# Patient Record
Sex: Male | Born: 1975 | Race: White | Hispanic: No | Marital: Married | Smoking: Never smoker
Health system: Southern US, Community
[De-identification: ages and names within clinical notes are randomized; demographics above are authoritative.]

## PROBLEM LIST (undated history)

## (undated) DIAGNOSIS — I422 Other hypertrophic cardiomyopathy: Secondary | ICD-10-CM

## (undated) DIAGNOSIS — I10 Essential (primary) hypertension: Secondary | ICD-10-CM

## (undated) DIAGNOSIS — G4733 Obstructive sleep apnea (adult) (pediatric): Secondary | ICD-10-CM

## (undated) DIAGNOSIS — R002 Palpitations: Secondary | ICD-10-CM

## (undated) DIAGNOSIS — J302 Other seasonal allergic rhinitis: Secondary | ICD-10-CM

## (undated) DIAGNOSIS — G4730 Sleep apnea, unspecified: Secondary | ICD-10-CM

## (undated) HISTORY — PX: OTHER SURGICAL HISTORY: SHX169

## (undated) HISTORY — DX: Other seasonal allergic rhinitis: J30.2

## (undated) HISTORY — PX: TONSILLECTOMY: SUR1361

## (undated) HISTORY — DX: Sleep apnea, unspecified: G47.30

## (undated) HISTORY — DX: Other hypertrophic cardiomyopathy: I42.2

## (undated) HISTORY — DX: Obstructive sleep apnea (adult) (pediatric): G47.33

## (undated) HISTORY — DX: Palpitations: R00.2

## (undated) HISTORY — DX: Essential (primary) hypertension: I10

---

## 2012-12-03 ENCOUNTER — Encounter
Admission: RE | Disposition: A | Discharge status: Home / Self Care | Payer: Self-pay | Source: Ambulatory Visit | Attending: Cardiovascular Disease

## 2012-12-03 ENCOUNTER — Ambulatory Visit: Payer: No Typology Code available for payment source | Admitting: Cardiovascular Disease

## 2012-12-03 ENCOUNTER — Ambulatory Visit
Admission: RE | Admit: 2012-12-03 | Discharge: 2012-12-03 | Disposition: A | Discharge status: Home / Self Care | Payer: No Typology Code available for payment source | Source: Ambulatory Visit | Attending: Cardiovascular Disease | Admitting: Cardiovascular Disease

## 2012-12-03 DIAGNOSIS — I2510 Atherosclerotic heart disease of native coronary artery without angina pectoris: Secondary | ICD-10-CM | POA: Insufficient documentation

## 2012-12-03 DIAGNOSIS — R079 Chest pain, unspecified: Secondary | ICD-10-CM | POA: Insufficient documentation

## 2012-12-03 LAB — CBC AND DIFFERENTIAL
Basophils Absolute Automated: 0.02 (ref 0.00–0.20)
Basophils Automated: 0 %
Eosinophils Absolute Automated: 0.16 (ref 0.00–0.70)
Eosinophils Automated: 3 %
Hematocrit: 42.2 % (ref 42.0–52.0)
Hgb: 13.6 g/dL (ref 13.0–17.0)
Immature Granulocytes Absolute: 0.01
Immature Granulocytes: 0 %
Lymphocytes Absolute Automated: 1.67 (ref 0.50–4.40)
Lymphocytes Automated: 29 %
MCH: 25.3 pg — ABNORMAL LOW (ref 28.0–32.0)
MCHC: 32.2 g/dL (ref 32.0–36.0)
MCV: 78.6 fL — ABNORMAL LOW (ref 80.0–100.0)
MPV: 12 fL (ref 9.4–12.3)
Monocytes Absolute Automated: 0.43 (ref 0.00–1.20)
Monocytes: 8 %
Neutrophils Absolute: 3.47 (ref 1.80–8.10)
Neutrophils: 60 %
Nucleated RBC: 0 (ref 0–1)
Platelets: 134 — ABNORMAL LOW (ref 140–400)
RBC: 5.37 (ref 4.70–6.00)
RDW: 13 % (ref 12–15)
WBC: 5.76 (ref 3.50–10.80)

## 2012-12-03 LAB — BASIC METABOLIC PANEL
BUN: 15 mg/dL (ref 9.0–21.0)
CO2: 27 (ref 22–29)
Calcium: 9.2 mg/dL (ref 8.5–10.5)
Chloride: 108 — ABNORMAL HIGH (ref 98–107)
Creatinine: 1 mg/dL (ref 0.7–1.3)
Glucose: 82 mg/dL (ref 70–100)
Potassium: 4.1 (ref 3.5–5.1)
Sodium: 142 (ref 136–145)

## 2012-12-03 LAB — GFR: EGFR: >60

## 2012-12-03 LAB — PT/INR
PT INR: 1.1 (ref 0.9–1.1)
PT: 14.4 (ref 12.6–15.0)

## 2012-12-03 SURGERY — LEFT HEART CATH POSS PCI
Anesthesia: Conscious Sedation | Laterality: Left

## 2012-12-03 MED ORDER — ATORVASTATIN CALCIUM 10 MG PO TABS
40.0000 mg | ORAL_TABLET | Freq: Every day | ORAL | Status: AC
Start: 2012-12-03 — End: ?

## 2012-12-03 MED ORDER — IODIXANOL 320 MG/ML IV SOLN
107.00 mL | Freq: Once | INTRAVENOUS | Status: AC | PRN
Start: 2012-12-03 — End: 2012-12-03
  Administered 2012-12-03: 107 mL via INTRA_ARTERIAL

## 2012-12-03 MED ORDER — VERAPAMIL HCL 2.5 MG/ML IV SOLN
INTRAVENOUS | Status: AC
Start: 2012-12-03 — End: 2012-12-03
  Administered 2012-12-03: 5 mg
  Filled 2012-12-03: qty 2

## 2012-12-03 MED ORDER — SODIUM CHLORIDE 0.9 % IV SOLN
INTRAVENOUS | Status: DC
Start: 2012-12-03 — End: 2012-12-03

## 2012-12-03 MED ORDER — MIDAZOLAM HCL 2 MG/2ML IJ SOLN
INTRAMUSCULAR | Status: AC
Start: 2012-12-03 — End: 2012-12-03
  Administered 2012-12-03: 2 mg via INTRAVENOUS
  Filled 2012-12-03: qty 2

## 2012-12-03 MED ORDER — SODIUM CHLORIDE 0.9 % IV SOLN
INTRAVENOUS | Status: AC
Start: 2012-12-03 — End: 2012-12-03

## 2012-12-03 MED ORDER — FENTANYL CITRATE 0.05 MG/ML IJ SOLN
INTRAMUSCULAR | Status: AC
Start: 2012-12-03 — End: 2012-12-03
  Administered 2012-12-03: 50 ug
  Filled 2012-12-03: qty 2

## 2012-12-03 MED ORDER — HEPARIN WASH BOWL 5 UNITS/ML SOLN (CATH LAB)
Status: AC
Start: 2012-12-03 — End: 2012-12-03
  Filled 2012-12-03: qty 2

## 2012-12-03 MED ORDER — LIDOCAINE HCL (PF) 1 % IJ SOLN
INTRAMUSCULAR | Status: AC
Start: 2012-12-03 — End: 2012-12-03
  Filled 2012-12-03: qty 30

## 2012-12-03 MED ORDER — HEPARIN SODIUM (PORCINE) 1000 UNIT/ML IJ SOLN
INTRAMUSCULAR | Status: AC
Start: 2012-12-03 — End: 2012-12-03
  Administered 2012-12-03: 3000 [IU] via INTRAVENOUS
  Filled 2012-12-03: qty 10

## 2012-12-03 MED ORDER — ATORVASTATIN CALCIUM 10 MG PO TABS
40.0000 mg | ORAL_TABLET | Freq: Every day | ORAL | Status: DC
Start: 2012-12-03 — End: 2012-12-03

## 2012-12-03 MED ORDER — NITROGLYCERIN IN D5W 200-5 MCG/ML-% IV SOLN
INTRAVENOUS | Status: AC
Start: 2012-12-03 — End: 2012-12-03
  Filled 2012-12-03: qty 5

## 2012-12-03 NOTE — Progress Notes (Signed)
Received patient at time from rn sagun

## 2012-12-03 NOTE — Plan of Care (Signed)
Vasc band removed per protocol. Ambulated pt in hallway. Pt voided in bathroom. Denies any CP, SOB or dizziness.

## 2012-12-03 NOTE — Discharge Instructions (Addendum)
[facility]  [address]  [place], [place]    Interventional Cardiovascular Admission and Recovery  Post Catheterization Discharge Instructions  Arm Access          Access Site: Radial  Artery    Activity:  1. Do not lift anything greater than five (5) pounds in weight and no strenuous activity for 48 hours.  2. No driving for 48 hours following your procedure.    Access Site Care:  1. You may shower 24 hours after your procedure.  Leave the bandage in place and just let the water passively flow over the site.  After 48 hours REMOVE the dressing before or during your shower.  Again, let the water passively flow over the site, wash gently with your hand, then pat the area dry.    2. Do not rub, pick or scratch the area.   3. Do not apply creams, powders, lotions, or ointments to the site.   4. Apply a regular sized Band-Aid to the puncture site and change it daily for five (5) days.  You may shower daily.  5. If the puncture site looks like it is becoming infected or not healing properly, (red, hot to touch, drainage, and/or fever over 101 degrees F), call the doctor who performed the procedure.    Normal Observation:  1. You may feel tenderness.      2. You may experience some mild bruising.      Call 911 if:  1. You are experiencing unrelieved chest pain.  2. You notice bleeding either through the dressing or underneath the skin.  If the blood is trapped under the skin, the area will hurt, become swollen and hard.  If either happens, lay down flat and hold pressure on the site.  This is an arterial bleed, and may become an emergency if unattended.    3. Your arm becomes cold, numb, painful, grayish in color, or change from usual color/sensation      Atorvastatin Calcium Oral tablet  What is this medicine?  ATORVASTATIN (a TORE Verdi sta tin) is known as a HMG-CoA reductase inhibitor or 'statin'. It lowers the level of cholesterol and triglycerides in the blood. This drug may also  reduce the risk of heart attack, stroke, or other health problems in patients with risk factors for heart disease. Diet and lifestyle changes are often used with this drug.  This medicine may be used for other purposes; ask your health care provider or pharmacist if you have questions.  What should I tell my health care provider before I take this medicine?  They need to know if you have any of these conditions:   frequently drink alcoholic beverages   history of stroke, TIA   kidney disease   liver disease   muscle aches or weakness   other medical condition   an unusual or allergic reaction to atorvastatin, other medicines, foods, dyes, or preservatives   pregnant or trying to get pregnant   breast-feeding  How should I use this medicine?  Take this medicine by mouth with a glass of water. Follow the directions on the prescription label. You can take this medicine with or without food. Take your doses at regular intervals. Do not take your medicine more often than directed.  Talk to your pediatrician regarding the use of this medicine in children. While this drug may be prescribed for children as young as 10 years old for selected conditions, precautions do apply.    Overdosage: If you think you have taken too much of this medicine contact a poison control center or emergency room at once.  NOTE: This medicine is only for you. Do not share this medicine with others.  What if I miss a dose?  If you miss a dose, take it as soon as you can. If it is almost time for your next dose, take only that dose. Do not take double or extra doses.  What may interact with this medicine?  Do not take this medicine with any of the following medications:   red yeast rice   telaprevir   telithromycin   voriconazole  This medicine may also interact with the following medications:   alcohol   antiviral medicines for HIV or AIDS   boceprevir   certain antibiotics like clarithromycin, erythromycin,  troleandomycin   certain medicines for cholesterol like fenofibrate or gemfibrozil   cimetidine   clarithromycin   colchicine   cyclosporine   digoxin   male hormones, like estrogens or progestins and birth control pills   grapefruit juice   medicines for fungal infections like fluconazole, itraconazole, ketoconazole   niacin   rifampin   spironolactone  This list may not describe all possible interactions. Give your health care provider a list of all the medicines, herbs, non-prescription drugs, or dietary supplements you use. Also tell them if you smoke, drink alcohol, or use illegal drugs. Some items may interact with your medicine.  What should I watch for while using this medicine?  Visit your doctor or health care professional for regular check-ups. You may need regular tests to make sure your liver is working properly.  Tell your doctor or health care professional right away if you get any unexplained muscle pain, tenderness, or weakness, especially if you also have a fever and tiredness. Your doctor or health care professional may tell you to stop taking this medicine if you develop muscle problems. If your muscle problems do not go away after stopping this medicine, contact your health care professional.  This drug is only part of a total heart-health program. Your doctor or a dietician can suggest a low-cholesterol and low-fat diet to help. Avoid alcohol and smoking, and keep a proper exercise schedule.  Do not use this drug if you are pregnant or breast-feeding. Serious side effects to an unborn child or to an infant are possible. Talk to your doctor or pharmacist for more information.  This medicine may affect blood sugar levels. If you have diabetes, check with your doctor or health care professional before you change your diet or the dose of your diabetic medicine.  If you are going to have surgery tell your health care professional that you are taking this drug.  What side effects may I  notice from receiving this medicine?  Side effects that you should report to your doctor or health care professional as soon as possible:   allergic reactions like skin rash, itching or hives, swelling of the face, lips, or tongue   dark urine   fever   joint pain   muscle cramps, pain   redness, blistering, peeling or loosening of the skin, including inside the mouth   trouble passing urine or change in the amount of urine   unusually weak or tired   yellowing of eyes or skin  Side effects that usually do not require medical attention (report to your doctor or health care professional if they continue or are bothersome):   constipation     heartburn   stomach gas, pain, upset  This list may not describe all possible side effects. Call your doctor for medical advice about side effects. You may report side effects to FDA at 1-800-FDA-1088.  Where should I keep my medicine?  Keep out of the reach of children.  Store at room temperature between 20 to 25 degrees C (68 to 77 degrees F). Throw away any unused medicine after the expiration date.  NOTE:This sheet is a summary. It may not cover all possible information. If you have questions about this medicine, talk to your doctor, pharmacist, or health care provider. Copyright 2014 Gold Standard

## 2012-12-03 NOTE — Progress Notes (Signed)
Received pt from Cath lab via bed. Placed on tele. VSS. Family called. Oriented pt to room. Call bell within reach. Vasc band in place to right radial artery. Aldrete score- 10.

## 2012-12-03 NOTE — Progress Notes (Signed)
Radial access site slightly swollen at the site. Manual pressure applied. Arm board applied. Monitor pt for 20 minutes.  Report given to [facility].     IV [place]'d Cannula intact. Tele [place]'d. [place] instruction given to pt and his family member. Prescription given to pt. Verbalized understanding. Bleeding precaution, safety precaution reviewed. Lawson pt if no bleeding or hematoma.

## 2012-12-03 NOTE — Plan of Care (Signed)
Pre- Cath Teaching and Learning Objectives   Learner: Lucas Sanchez,   Preference for learning: Verbal  Teaching Method: Verbal Instruction   Outcome of Learning: 5/5    Described/Demonstrated the following:     + Responsibilities of patient's care  + Cardiac Cath  + Purpose of procedure  + Need to be NPO pre-procedure  + Need for maintaining bedrest & straight leg post-procedure & sheath removal.  + Necessary fluid intake after procedure  + Symptoms of bleeding & states plan to notify nurse.

## 2012-12-03 NOTE — Progress Notes (Signed)
Radial site remains d/i with no noted hematoma/ [place] via w/c escorted per staff and family

## 2012-12-03 NOTE — H&P (Signed)
BRIEF H&P Includes ASA    Date Time: 12/03/2012 3:57 PM      PROCEDURALIST COMMENTS BELOW:         INDICATIONS:   Chest pain and abnormal ECG       REVIEW OF SYSTEMS:   YES  ( x )         ALLERGIES:     NO  ( x )   YES  (  )        PHYSICAL EXAM     AIRWAY CLASSIFICATION:    CLASS I   (  x)     CLASS II  (  )    CLASS III  (  )     CLASS IV  (  )    CARDIAC :   (x  )  RRR  (  )  IRREG  (  )  MURMUR      LUNGS:   (x  )  CLEAR  (  )  DIMINISHED    (  ) LEFT   (  )  RIGHT  (  )  ABSENT          (  ) LEFT   (  )  RIGHT  (  )  TUBES            (  ) LEFT   (  )  RIGHT          ABDOMEN:         NEURO:         OTHER:       LABS:     Lab Results   Component Value Date/Time    WBC 5.76 12/03/2012  2:15 PM    HCT 42.2 12/03/2012  2:15 PM    PLT 134* 12/03/2012  2:15 PM    INR 1.1 12/03/2012  2:15 PM    PT 14.4 12/03/2012  2:15 PM    BUN 15.0 12/03/2012  2:15 PM    GLU 82 12/03/2012  2:15 PM    K 4.1 12/03/2012  2:15 PM           ASA PHYSICAL STATUS   (x  )  ASA 1   HEALTHY PATIENT  (  )  ASA 2   MILD SYSTEMIC ILLNESS  (  )  ASA 3   SYSTEMIC DISEASE, NOT INCAPACITATING  (  )  ASA 4   SEVERE SYSTEMIC DISEASE, DISEASE IS CONSTANT THREAT TO                         LIFE  (  )  ASA 5   MORIBUND CONDITION, NOT EXPECTED TO LIVE >24 HOURS            IRRESPECTIVE OF PROCEDURE  (  )  E           EMERGENCY PROCEDURE       PLANNED SEDATION:   (  ) NO SEDATION  ( x ) MODERATE SEDATION  (  ) DEEP SEDATION WITH ANESTHESIA      CONCLUSION:   PATIENT HAS BEEN REASSESSED IMMEDIATELY PRIOR TO THE PROCEDURE   AND IS AN APPROPRIATE CANDIDATE FOR THE PLANNED SEDATION AND   PROCEDURE.  RISKS, BENEFITS AND ALTERNATIVES TO THE PLANNED   PROCEDURE AND SEDATION HAVE BEEN EXPLAINED TO THE PATIENT   OR GUARDIAN.    ( x )  YES  (  )  EMERGENCY CONSENT  Signed by Geanie Cooley.

## 2013-01-21 ENCOUNTER — Other Ambulatory Visit: Payer: Self-pay | Admitting: Cardiovascular Disease

## 2013-01-21 DIAGNOSIS — I422 Other hypertrophic cardiomyopathy: Secondary | ICD-10-CM

## 2013-01-21 DIAGNOSIS — R9431 Abnormal electrocardiogram [ECG] [EKG]: Secondary | ICD-10-CM

## 2013-01-25 ENCOUNTER — Ambulatory Visit: Payer: No Typology Code available for payment source | Attending: Cardiovascular Disease

## 2013-01-25 DIAGNOSIS — R079 Chest pain, unspecified: Secondary | ICD-10-CM | POA: Insufficient documentation

## 2013-01-25 DIAGNOSIS — I517 Cardiomegaly: Secondary | ICD-10-CM | POA: Insufficient documentation

## 2013-01-27 MED ORDER — GADOBUTROL 1 MMOL/ML IV SOLN
15.0000 mL | Freq: Once | INTRAVENOUS | Status: AC | PRN
Start: 2013-01-27 — End: 2013-01-25
  Administered 2013-01-25: 15 mmol via INTRAVENOUS
  Filled 2013-01-27: qty 15

## 2013-08-15 DIAGNOSIS — I422 Other hypertrophic cardiomyopathy: Secondary | ICD-10-CM | POA: Insufficient documentation

## 2015-03-06 ENCOUNTER — Ambulatory Visit (INDEPENDENT_AMBULATORY_CARE_PROVIDER_SITE_OTHER): Payer: Self-pay | Admitting: Cardiovascular Disease

## 2015-03-16 ENCOUNTER — Ambulatory Visit (INDEPENDENT_AMBULATORY_CARE_PROVIDER_SITE_OTHER): Payer: Self-pay

## 2016-04-21 ENCOUNTER — Ambulatory Visit (INDEPENDENT_AMBULATORY_CARE_PROVIDER_SITE_OTHER): Payer: Self-pay | Admitting: Cardiovascular Disease

## 2017-03-02 ENCOUNTER — Other Ambulatory Visit (INDEPENDENT_AMBULATORY_CARE_PROVIDER_SITE_OTHER): Payer: Self-pay | Admitting: Family Medicine

## 2017-05-07 ENCOUNTER — Ambulatory Visit (INDEPENDENT_AMBULATORY_CARE_PROVIDER_SITE_OTHER): Payer: Self-pay | Admitting: Cardiovascular Disease

## 2017-07-13 ENCOUNTER — Other Ambulatory Visit (INDEPENDENT_AMBULATORY_CARE_PROVIDER_SITE_OTHER): Payer: Self-pay | Admitting: Physician Assistant

## 2018-01-25 DIAGNOSIS — G4733 Obstructive sleep apnea (adult) (pediatric): Secondary | ICD-10-CM | POA: Insufficient documentation

## 2018-01-25 DIAGNOSIS — I10 Essential (primary) hypertension: Secondary | ICD-10-CM | POA: Insufficient documentation

## 2018-12-22 LAB — CMP12+2AC
ALT: 36 IU/L (ref 0–44)
AST (SGOT): 21 IU/L (ref 0–40)
Albumin/Globulin Ratio: 2.1 (ref 1.2–2.2)
Albumin: 4.5 g/dL (ref 3.5–5.5)
Alkaline Phosphatase: 43 IU/L (ref 39–117)
BUN / Creatinine Ratio: 19 (ref 9–20)
BUN: 20 mg/dL (ref 6–24)
Bilirubin, Total: 0.5 mg/dL (ref 0.0–1.2)
Calcium: 9.2 mg/dL (ref 8.7–10.2)
Chloride: 103 mmol/L (ref 96–106)
Creatinine: 1.07 mg/dL (ref 0.76–1.27)
EGFR: 85 mL/min/{1.73_m2} (ref >59)
EGFR: 98 mL/min/{1.73_m2} (ref >59)
Globulin, Total: 2.1 g/dL (ref 1.5–4.5)
Glucose: 84 mg/dL (ref 65–99)
Potassium: 4.5 mmol/L (ref 3.5–5.2)
Protein, Total: 6.6 g/dL (ref 6.0–8.5)
Sodium: 140 mmol/L (ref 134–144)
Uric acid: 6.3 mg/dL (ref 3.7–8.6)

## 2018-12-22 LAB — FFPC LIPID PANEL AND CHOL/HDL RATIO
Cholesterol / HDL Ratio: 7.4 ratio — ABNORMAL HIGH (ref 0.0–5.0)
Cholesterol: 185 mg/dL (ref 100–199)
HDL: 25 mg/dL — ABNORMAL LOW (ref >39)
Triglycerides: 451 mg/dL — ABNORMAL HIGH (ref 0–149)

## 2018-12-22 LAB — REFLEX - DIRECT LDL: LDL CHOLESTEROL DIRECT: 81 mg/dL (ref 0–99)

## 2018-12-22 LAB — UPPER RESPIRATORY CULTURE

## 2019-09-30 ENCOUNTER — Ambulatory Visit (INDEPENDENT_AMBULATORY_CARE_PROVIDER_SITE_OTHER): Payer: 59

## 2019-09-30 ENCOUNTER — Encounter: Payer: Self-pay | Admitting: Podiatry

## 2019-09-30 ENCOUNTER — Ambulatory Visit (INDEPENDENT_AMBULATORY_CARE_PROVIDER_SITE_OTHER): Payer: 59 | Admitting: Podiatry

## 2019-09-30 ENCOUNTER — Other Ambulatory Visit: Payer: Self-pay | Admitting: *Deleted

## 2019-09-30 ENCOUNTER — Encounter: Payer: Self-pay | Admitting: *Deleted

## 2019-09-30 ENCOUNTER — Other Ambulatory Visit: Payer: Self-pay

## 2019-09-30 DIAGNOSIS — M7751 Other enthesopathy of right foot: Secondary | ICD-10-CM

## 2019-09-30 DIAGNOSIS — M7670 Peroneal tendinitis, unspecified leg: Secondary | ICD-10-CM

## 2019-09-30 DIAGNOSIS — M779 Enthesopathy, unspecified: Secondary | ICD-10-CM | POA: Diagnosis not present

## 2019-09-30 DIAGNOSIS — M7672 Peroneal tendinitis, left leg: Secondary | ICD-10-CM | POA: Diagnosis not present

## 2019-09-30 MED ORDER — DICLOFENAC SODIUM 75 MG PO TBEC: 75.0000 mg | DELAYED_RELEASE_TABLET | Freq: Two times a day (BID) | ORAL | 2 refills | Status: DC

## 2019-09-30 NOTE — Progress Notes (Signed)
Subjective:   Patient ID: Paul English, male   DOB: 44 y.o.   MRN: [id number]   HPI Patient states he developed a lot of pain over the last few months in his right over left foot and does not remember specific injury.  States it is on the outside dorsum of the foot and that it is bothersome and worse after periods of sitting and when getting up in the morning.  Patient does not smoke likes to be active if possible   Review of Systems  All other systems reviewed and are negative.       Objective:  Physical Exam Vitals and nursing note reviewed.  Constitutional:      Appearance: He is well-developed.  Pulmonary:     Effort: Pulmonary effort is normal.  Musculoskeletal:        General: Normal range of motion.  Skin:    General: Skin is warm.  Neurological:     Mental Status: He is alert.     Neurovascular status intact muscle strength found to be adequate range of motion within normal limits.  Patient is noted to have exquisite discomfort dorsal lateral aspect foot region bilateral with the right foot being more distal than the left in the peroneal tertius and base of fourth fifth metatarsal complex along with the extensor tendon.  Patient is found to have good digital perfusion well oriented x3     Assessment:  Probability for inflammatory tendinitis bilateral which may have been injury or other pathology     Plan:  H&P conditions reviewed and I have recommended aggressive conservative treatment.  I did sterile prep of each foot I injected the tendon complex bilateral 3 mg Kenalog 5 mg Xylocaine I advised on ice therapy anti-inflammatories and placed on diclofenac.  Reappoint 3 weeks to recheck  X-rays indicate no signs of stress fracture or arthritic process associated with problem

## 2019-10-04 ENCOUNTER — Other Ambulatory Visit: Payer: Self-pay | Admitting: Podiatry

## 2019-10-04 DIAGNOSIS — M779 Enthesopathy, unspecified: Secondary | ICD-10-CM

## 2019-10-21 ENCOUNTER — Encounter: Payer: Self-pay | Admitting: Podiatry

## 2019-10-21 ENCOUNTER — Ambulatory Visit (INDEPENDENT_AMBULATORY_CARE_PROVIDER_SITE_OTHER): Payer: 59 | Admitting: Podiatry

## 2019-10-21 ENCOUNTER — Other Ambulatory Visit: Payer: Self-pay

## 2019-10-21 VITALS — Temp 97.7°F

## 2019-10-21 DIAGNOSIS — M779 Enthesopathy, unspecified: Secondary | ICD-10-CM | POA: Diagnosis not present

## 2019-10-21 DIAGNOSIS — M7670 Peroneal tendinitis, unspecified leg: Secondary | ICD-10-CM | POA: Diagnosis not present

## 2019-10-21 NOTE — Progress Notes (Signed)
Subjective:   Patient ID: Paul English, male   DOB: 44 y.o.   MRN: [id number]   HPI Patient presents stating that his feet feel quite a bit better and his goal now is to lose weight and be able to be more active   ROS      Objective:  Physical Exam  Neurovascular status intact with significant diminishment of discomfort in the sinus tarsi and peroneal tertius brevis group bilateral     Assessment:  Improvement of tendinitis bilateral capsulitis condition with mild discomfort still noted only upon deep palpation     Plan:  H&P reviewed condition at great length discussed gradual increase in activity continue oral anti-inflammatories topical medicines as needed shoe gear modifications and also reviewed with him the consideration for weight loss which I think at his stage would be important.  Patient wants to undergo this and at this point will start to increase activity and is encouraged to call with questions concerns

## 2020-02-01 ENCOUNTER — Other Ambulatory Visit: Payer: Self-pay | Admitting: Podiatry

## 2020-03-28 ENCOUNTER — Emergency Department (HOSPITAL_BASED_OUTPATIENT_CLINIC_OR_DEPARTMENT_OTHER): Payer: 59

## 2020-03-28 ENCOUNTER — Other Ambulatory Visit: Payer: Self-pay

## 2020-03-28 ENCOUNTER — Emergency Department (HOSPITAL_BASED_OUTPATIENT_CLINIC_OR_DEPARTMENT_OTHER)
Admission: EM | Admit: 2020-03-28 | Discharge: 2020-03-28 | Disposition: A | Discharge status: Home / Self Care | Payer: 59 | Attending: Emergency Medicine | Admitting: Emergency Medicine

## 2020-03-28 ENCOUNTER — Encounter (HOSPITAL_BASED_OUTPATIENT_CLINIC_OR_DEPARTMENT_OTHER): Payer: Self-pay

## 2020-03-28 DIAGNOSIS — Z79899 Other long term (current) drug therapy: Secondary | ICD-10-CM | POA: Insufficient documentation

## 2020-03-28 DIAGNOSIS — I10 Essential (primary) hypertension: Secondary | ICD-10-CM | POA: Insufficient documentation

## 2020-03-28 DIAGNOSIS — Z7982 Long term (current) use of aspirin: Secondary | ICD-10-CM | POA: Insufficient documentation

## 2020-03-28 DIAGNOSIS — R0781 Pleurodynia: Secondary | ICD-10-CM | POA: Diagnosis present

## 2020-03-28 LAB — CBC WITH DIFFERENTIAL/PLATELET
Abs Immature Granulocytes: 0 10*3/uL (ref 0.00–0.07)
Basophils Absolute: 0 10*3/uL (ref 0.0–0.1)
Basophils Relative: 1 %
Eosinophils Absolute: 0.1 10*3/uL (ref 0.0–0.5)
Eosinophils Relative: 2 %
HCT: 46.3 % (ref 39.0–52.0)
Hemoglobin: 14.9 g/dL (ref 13.0–17.0)
Immature Granulocytes: 0 %
Lymphocytes Relative: 31 %
Lymphs Abs: 1.9 10*3/uL (ref 0.7–4.0)
MCH: 26.3 pg (ref 26.0–34.0)
MCHC: 32.2 g/dL (ref 30.0–36.0)
MCV: 81.8 fL (ref 80.0–100.0)
Monocytes Absolute: 0.6 10*3/uL (ref 0.1–1.0)
Monocytes Relative: 9 %
Neutro Abs: 3.5 10*3/uL (ref 1.7–7.7)
Neutrophils Relative %: 57 %
Platelets: 162 10*3/uL (ref 150–400)
RBC: 5.66 MIL/uL (ref 4.22–5.81)
RDW: 13 % (ref 11.5–15.5)
WBC: 6 10*3/uL (ref 4.0–10.5)
nRBC: 0 % (ref 0.0–0.2)

## 2020-03-28 LAB — COMPREHENSIVE METABOLIC PANEL
ALT: 49 U/L — ABNORMAL HIGH (ref 0–44)
AST: 31 U/L (ref 15–41)
Albumin: 4.3 g/dL (ref 3.5–5.0)
Alkaline Phosphatase: 41 U/L (ref 38–126)
Anion gap: 8 (ref 5–15)
BUN: 20 mg/dL (ref 6–20)
CO2: 24 mmol/L (ref 22–32)
Calcium: 9 mg/dL (ref 8.9–10.3)
Chloride: 106 mmol/L (ref 98–111)
Creatinine, Ser: 1.08 mg/dL (ref 0.61–1.24)
GFR calc Af Amer: >60 mL/min (ref >60)
GFR calc non Af Amer: >60 mL/min (ref >60)
Glucose, Bld: 83 mg/dL (ref 70–99)
Potassium: 4.4 mmol/L (ref 3.5–5.1)
Sodium: 138 mmol/L (ref 135–145)
Total Bilirubin: 0.6 mg/dL (ref 0.3–1.2)
Total Protein: 7 g/dL (ref 6.5–8.1)

## 2020-03-28 LAB — URINALYSIS, ROUTINE W REFLEX MICROSCOPIC
Bilirubin Urine: NEGATIVE
Glucose, UA: NEGATIVE mg/dL
Hgb urine dipstick: NEGATIVE
Ketones, ur: NEGATIVE mg/dL
Leukocytes,Ua: NEGATIVE
Nitrite: NEGATIVE
Protein, ur: NEGATIVE mg/dL
Specific Gravity, Urine: >1.03 — ABNORMAL HIGH (ref 1.005–1.030)
pH: 6 (ref 5.0–8.0)

## 2020-03-28 LAB — D-DIMER, QUANTITATIVE: D-Dimer, Quant: 0.28 ug/mL-FEU (ref 0.00–0.50)

## 2020-03-28 LAB — LIPASE, BLOOD: Lipase: 28 U/L (ref 11–51)

## 2020-03-28 NOTE — ED Provider Notes (Signed)
[facility] EMERGENCY DEPARTMENT Provider Note   CSN: [id number] Arrival date & time: 03/28/20  1657     History Chief Complaint  Patient presents with  . Flank Pain    Paul English is a 44 y.o. male.  Patient is a 44 year old male with a history of hypertension, obesity and sleep apnea who presents with right side chest pain. He has some pain in his right lower ribs. It started yesterday. Is been fairly constant. Is been gradually getting worse. It hurts when he moves and hurts when he takes a deep breath. He was moving some furniture but denies any other recent injuries. No leg pain or swelling. No history of VTE. No recent surgeries or immobilization. He does not report any shortness of breath. No other chest pain. It radiates around the area but does not go down into his abdomen. No associated back pain. No urinary symptoms. No nausea or vomiting. No known fevers. No cough or cold symptoms. He has not taken anything for the pain.        History reviewed. No pertinent past medical history.  Patient Active Problem List   Diagnosis Date Noted  . Severe obesity (BMI 35.0-39.9) with comorbidity (HCC) 09/08/2018  . Hypertension, essential 01/25/2018  . OSA (obstructive sleep apnea) 01/25/2018  . 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 No pertinent surgical history.     No family history on file.  Social History   Tobacco Use  . Smoking status: Never Smoker  . Smokeless tobacco: Never Used  Vaping Use  . Vaping Use: Never used  Substance Use Topics  . Alcohol use: Not on file  . Drug use: Never    Home Medications Prior to Admission medications   Medication Sig Start Date End Date Taking? Authorizing Provider  aspirin EC 81 MG tablet Take 81 mg by mouth daily. Swallow whole.   Yes [provider]  candesartan (ATACAND) 4 MG tablet Take 4 mg by mouth daily. 10/15/19  Yes [provider]  cyclobenzaprine  (FLEXERIL) 5 MG tablet Take 5 mg by mouth at bedtime.   Yes [provider]  fexofenadine (ALLEGRA) 60 MG tablet Take 60 mg by mouth at bedtime.   Yes [provider]  Multiple Vitamin (MULTIVITAMIN) tablet Take 1 tablet by mouth daily.   Yes [provider]  verapamil (CALAN-SR) 180 MG CR tablet Take 180 mg by mouth daily. 10/15/19  Yes [provider]  diclofenac (VOLTAREN) 75 MG EC tablet TAKE 1 TABLET BY MOUTH TWICE DAILY 02/02/20   Lenn Sink, DPM    Allergies    Patient has no known allergies.  Review of Systems   Review of Systems  Constitutional: Negative for chills, diaphoresis, fatigue and fever.  HENT: Negative for congestion, rhinorrhea and sneezing.   Eyes: Negative.   Respiratory: Negative for cough, chest tightness and shortness of breath.   Cardiovascular: Positive for chest pain (Right rib pain). Negative for leg swelling.  Gastrointestinal: Negative for abdominal pain, blood in stool, diarrhea, nausea and vomiting.  Genitourinary: Positive for flank pain. Negative for difficulty urinating, frequency and hematuria.  Musculoskeletal: Negative for arthralgias and back pain.  Skin: Negative for rash.  Neurological: Negative for dizziness, speech difficulty, weakness, numbness and headaches.    Physical Exam Updated Vital Signs BP 126/84 (BP Location: Right Arm)   Pulse 65   Temp 98.2 F (36.8 C) (Oral)   Resp 19   Ht 6\' 1"  (1.854 m)  Wt 129.3 kg   SpO2 100%   BMI 37.60 kg/m   Physical Exam Constitutional:      Appearance: He is well-developed.  HENT:     Head: Normocephalic and atraumatic.  Eyes:     Pupils: Pupils are equal, round, and reactive to light.  Cardiovascular:     Rate and Rhythm: Normal rate and regular rhythm.     Heart sounds: Normal heart sounds.     Comments: Positive point tenderness to the right lower ribs. No external lesions or bruising. No crepitus or deformity Pulmonary:     Effort: Pulmonary  effort is normal. No respiratory distress.     Breath sounds: Normal breath sounds. No wheezing or rales.  Chest:     Chest wall: No tenderness.  Abdominal:     General: Bowel sounds are normal.     Palpations: Abdomen is soft.     Tenderness: There is no abdominal tenderness. There is no guarding or rebound.  Musculoskeletal:        General: Normal range of motion.     Cervical back: Normal range of motion and neck supple.  Lymphadenopathy:     Cervical: No cervical adenopathy.  Skin:    General: Skin is warm and dry.     Findings: No rash.  Neurological:     Mental Status: He is alert and oriented to person, place, and time.     ED Results / Procedures / Treatments   Labs (all labs ordered are listed, but only abnormal results are displayed) Labs Reviewed  URINALYSIS, ROUTINE W REFLEX MICROSCOPIC - Abnormal; Notable for the following components:      Result Value   Specific Gravity, Urine >1.030 (*)    All other components within normal limits  COMPREHENSIVE METABOLIC PANEL - Abnormal; Notable for the following components:   ALT 49 (*)    All other components within normal limits  LIPASE, BLOOD  CBC WITH DIFFERENTIAL/PLATELET  D-DIMER, QUANTITATIVE (NOT AT [facility])    EKG None  Radiology DG Ribs Unilateral W/Chest Right  Result Date: 03/28/2020 CLINICAL DATA:  Right-sided rib pain with movement, no known injury, initial encounter EXAM: RIGHT RIBS AND CHEST - 3+ VIEW COMPARISON:  None. FINDINGS: Cardiac shadow is within normal limits. The lungs are well aerated bilaterally. Calcified granulomas and calcified mediastinal nodes are seen. No infiltrate or effusion is noted. No rib fracture is seen. IMPRESSION: Changes of prior granulomatous disease. No acute rib abnormality is noted. Electronically Signed   By: Alcide Clever M.D.   On: 03/28/2020 19:05    Procedures Procedures (including critical care time)  Medications Ordered in ED Medications - No data to display  ED  Course  I have reviewed the triage vital signs and the nursing notes.  Pertinent labs & imaging results that were available during my care of the patient were reviewed by me and considered in my medical decision making (see chart for details).    MDM Rules/Calculators/A&P                          Patient is a 44 year old male who presents with pain in his right lower ribs.  He is point tender on palpation of those ribs.  It started yesterday.  He does not report any known injury although he was moving some furniture recently.  He did have some pleuritic component but no shortness of breath.  He has a normal D-dimer and no other suggestions  of PE.  Chest x-ray shows no evidence of pneumonia or pneumothorax.  He does not have any associate abdominal pain.  There is no pain over the gallbladder on palpation.  There is no other pain that sounds more suggestive of a kidney stone.  There is also no associated hematuria.  No evidence of urinary tract infection.  I discussed these findings with the patient.  He declines need for any pain medication.  I suspect that this is musculoskeletal in nature.  He was discharged home in good condition.  He was advised use ibuprofen and Voltaren gel for symptomatic relief.  He was advised to follow-up with his PCP if his symptoms are not improving in the next few days.  Return precautions were given. Final Clinical Impression(s) / ED Diagnoses Final diagnoses:  Rib pain on right side    Rx / DC Orders ED Discharge Orders    None       Rolan Bucco, MD 03/28/20 2001

## 2020-03-28 NOTE — ED Triage Notes (Signed)
Pt c/o right flank pain started yesterday-denies other sx-NAD-steady gait

## 2020-03-28 NOTE — Discharge Instructions (Signed)
Use ibuprofen and Voltaren gel for symptomatic relief.  Follow-up with your primary care doctor for recheck if it is not getting better in the next few days.  Return here as needed if you have any worsening symptoms.

## 2020-04-29 ENCOUNTER — Other Ambulatory Visit: Payer: Self-pay | Admitting: Podiatry

## 2020-07-20 ENCOUNTER — Other Ambulatory Visit: Payer: 59

## 2020-07-20 DIAGNOSIS — Z20822 Contact with and (suspected) exposure to covid-19: Secondary | ICD-10-CM

## 2020-07-22 LAB — NOVEL CORONAVIRUS, NAA: SARS-CoV-2, NAA: DETECTED — AB

## 2020-07-22 LAB — SARS-COV-2, NAA 2 DAY TAT

## 2020-07-28 ENCOUNTER — Other Ambulatory Visit: Payer: Self-pay | Admitting: Podiatry

## 2020-07-30 NOTE — Telephone Encounter (Signed)
Please advise 

## 2020-07-31 NOTE — Telephone Encounter (Signed)
I will approve but he should be seen if still having pain

## 2020-10-09 NOTE — Progress Notes (Signed)
[facility] OFFICE  [address] [place], [place] [address]     Lucas Sanchez    Date of Visit:  05/07/2017  Date of Birth: 1975-08-18  Age: 45 yrs.   Medical Record Number: [id number]  __  CURRENT DIAGNOSES     1. 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 Palpitations, R00.2  3. Abnormal electrocardiogram [ECG] [EKG], R94.31  4. Hyperlipidemia, mixed, E78.2  __  ALLERGIES     Carvedilol, Fatigue  __  MEDICATIONS     1. aspirin 81 mg tablet,delayed release (DR/EC), 1  po qd  2. Flonase 50 mcg/actuation spray,suspension, PRN  3. multivitamin tablet, 1 po qd  4. fexofenadine 180 mg tablet, prn  5. carvedilol 6.25 mg tablet, 1 po bid  6. Aleve 220 mg capsule, PRN  7. candesartan 4 mg tablet, 1  po qd  8. Ranexa 500 mg tablet,extended release, 1 po bid  __  CHIEF COMPLAINT/REASON FOR VISIT  Followup of Abnormal electrocardiogram [ECG]  [EKG], Followup of Cardiomyopathy nonobstructive hypertrophic, Followup of Hyperlipidemia, mixed, Followup of Other cardiomyopathies and Followup of Palpitations  __  HISTORY OF PRESENT ILLNESS   [name] is a 45 year old man who returns to the office for follow-up. He has been feeling well from a cardiac standpoint but notes that he has been aware of episodes of atrial flutters that last for a few minutes. He has been taking all his medications  consistently but notes recent side effects of erectile dysfunction. [name] states that he previously took a beta blocker that decreased his energy levels and him feel like a "zombie". However, he cannot recollect the name of the medication. He exercises  occasionally and denies decrease in exertional tolerance, shortness of breath or anginal symptoms. His blood pressure in the office today is 134/84. However, this is higher than what he records at home. He reports that he has missed two doses over the  past two nights.   __  PAST HISTORY     Past Medical Illnesses : sleep apnea;  Past Cardiac Illnesses:  Palpitations, abnormal EKG, Hypertrophic cardiomyopathy, apical  variant, non-obstructive, Coronary Artery Disease; Infectious Diseases: No previous history of significant infectious diseases.;  Surgical Procedures: Tonsillectomy; Trauma History: No previous history of significant trauma.;  Cardiology Procedures-Invasive: Cardiac Cath (left) May 2014; Cardiology Procedures-Noninvasive: Echocardiogram  July 2014, cardiac MRI, July 2014, Holter Monitor September 2016; Cardiac Cath Results: 80 % stenosis PLR, small vessel, EF50%;  Left Ventricular Ejection Fraction: LVEF of 70% documented via echocardiogram on 01/11/2013  ___   FAMILY HISTORY  Father -- Myocardial infarction, Coronary Artery Disease  Mother --  Hypertension    __  CARDIAC RISK FACTORS     Tobacco Abuse: has never used tobacco;  Family History of Heart Disease: positive; Hyperlipidemia: negative;  Hypertension: negative;  Diabetes Mellitus: negative;  Prior History of Heart Disease: negative; Obesity: positive;  Sedentary Life Style:negative; JWJ:XBJYNWGN; Menopausal :not applicable  __  SOCIAL HISTORY     Alcohol Use: Does not use alcohol; Smoking: Does not smoke; Never smoker ([id number]);  Diet: Regular diet and 2 cups decaff coffee daily; Exercise: Exercises daily;   __   REVIEW OF SYSTEMS    General: Positive for feels well, no change in exercise tolerance.;  Integumentary: Denies any change in hair or nails, rashes, or skin lesions.; Eyes: Positive for wears  eye glasses/contact lenses; Ears, Nose, Throat, Mouth: Denies any hearing loss, epistaxis, hoarseness or difficulty speaking.; Respiratory: Positive for dyspnea with exertion,  Positive for sleep apnea; Abdominal : Denies ulcer  disease, hematochezia or melena.;Musculoskeletal:Denies any history of venous insufficiency, arthritic symptoms or back problems.;  Neurological : Denies any history of recurrent strokes, TIA, or seizure disorder.; Psychiatric: Positive  for anxiety; Endocrine: Denies  any history of weight change, heat/cold intolerance, polydipsia, or polyuria;  Hematologic/Immunologic: Positive for seasonal allergies  __  PHYSICAL EXAMINATION    Vital  Signs:  Blood Pressure:  134/84 Sitting, Left arm, large cuff  130/84 Sitting, Right arm, large cuff     Weight: 272.00 lbs.  Height: 73.00"  BMI:  36   Pulse: 61/min. Apical       Constitutional:  Cooperative, alert and oriented,well developed, well nourished, in no acute distress. Skin: Warm and dry to touch, no apparent skin lesions, or masses  noted. Head: Normocephalic, normal hair pattern, no masses or tenderness Eyes : conjunctivae and lids unremarkable, Funduscopic exam and visual fields not performed, glasses ENT: Ears, Nose and throat reveal no gross abnormalities  Neck: no JVD, no bruits, no masses, non-tender, carotid pulse(s) 3+, brisk Chest: Normal symmetry,  no tenderness to palpation, normal respiratory excursion, no intercostal retraction, no use of accessory muscles, normal diaphragmatic excursion, clear to auscultation and percussion. Cardiac : Regular rhythm, S1 normal, S2 normal, No S3 or S4, Apical impulse not displaced, no murmurs, gallops or rubs detected., no changes with Valsalva maneuver Abdomen : Abdomen soft, bowel sounds normoactive, no masses, no hepatosplenomegaly, non-tender, no bruits, midly obese Peripheral Pulses: bilateral radial pulse(s)  2+, bilateral dorsalis pedis pulse(s) 2+, bilateral posterior tibial pulse(s) 2+ Extremities/Back: No deformities, clubbing, cyanosis, erythema or edema  observed. There are no spinal abnormalities noted. Normal muscle strength and tone. Neurological: No gross motor or sensory deficits noted, affect appropriate,  oriented to time, person and place.   __    Medications added today by the physician:  candesartan 4 mg tablet, 1 po qd, 90  Ranexa  500 mg tablet,extended release, 1 po bid, 90    ECG: sinus rhythm with left ventricular hypertrophy and repolarization abnormality.      IMPRESSIONS:  1. Apical-variant hypertrophic cardiomyopathy, confirmed by evaluation at [facility]. He continues to do well and notes only very transient palpitations. His   exercise capacity appears good. His cardiac exam and ECG are unchanged in the office   today.   2. Obesity with sleep apnea.  3. Erectile dysfunction, probably  related to carvedilol. He was instructed to reduce to one-half   twice daily and discontinue all together if no significant improvement on lower dose.     RECOMMENDATIONS:  1. Reduce carvedilol to one-half tablet twice daily as noted above  and discontinue all together   if erectile dysfunction persists on lower dose.   2. Continued regular exercise was encouraged  3. Return visit in 6 months or sooner if new problems arise.     Documented by Armando Gang, acting as  a scribe for Dr. Kinnie Scales, MD, [facility].    I, Dr. Kinnie Scales, personally performed the services described in the documentation, as scribed by Malachi Bonds Adubofour in my presence, and it is both accurate and correct.    Kinnie Scales,  MD, [facility]     cc: Laurene Footman MD  ____________________________  Christianne Dolin  Diet_mgmt_edu,_guidance_and_counseling TODAY  12 Lead ECG Today  Return Visit 15 MIN 6 months

## 2020-10-09 NOTE — Progress Notes (Signed)
[facility] OFFICE  [address] [place], [place] [address]     Lucas Sanchez    Date of Visit:  03/06/2015  Date of Birth: [birth year]  Age: 45 yrs.   Medical Record Number: [id number]  __  CURRENT DIAGNOSES     1. Other cardiomyopathies, I42.8   2. Palpitations, R00.2  3. Abnormal electrocardiogram [ECG] [EKG], R94.31  4. 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 aspirin 81 mg tablet,delayed release (DR/EC),  1 po qd  2. Flonase 50 mcg/actuation spray,suspension, PRN  3. Ranexa 500 mg tablet,extended release, 1 po bid  4. candesartan 4 mg tablet, 1 po qd  5. multivitamin tablet, 1 po qd  6. fexofenadine 180 mg tablet, prn  __   CHIEF COMPLAINT/REASON FOR VISIT  Followup of Abnormal electrocardiogram [ECG] [EKG], Followup of Other cardiomyopathies and Followup of Palpitations  __   HISTORY OF PRESENT ILLNESS  Mr. Sowash returns to the office today for annual follow-up. He has noticed that he is more fatigue than before. He reports a 24-hour Holter monitor has been recommended for  his palpitations by the Cardiomyopathy Clinic at [facility]. He describes his palpitations as irregular heartbeat or fluttering that can sometimes wake him up at night and last for a few seconds before subsiding at a time. He also has been  experiencing consistent dizziness after he stands up from sitting that usually lasts for a few seconds. He had discontinued Ranexa at one point, but as he noticed that his palpitations and dizziness were worse off Ranexa, he started back on Ranexa and  reports that his symptoms have somewhat improved on Ranexa. He denies experiencing any chest pain, shortness of breath, syncopal episodes, or symptoms of congestive heart failure. His blood pressure in the office today is 138/70 mmHg. He has gained 11  pounds of weight since his previous office visit.    His lipid panel drawn on 12/16/2013 showed a total  cholesterol level of 162, LDL of 114, HDL of 28, and triglycerides of 100 mg/dL.  __   PAST HISTORY     Past Medical Illnesses: sleep apnea;   Past Cardiac Illnesses: Palpitations, abnormal EKG, Hypertrophic cardiomyopathy, apical variant, non-obstructive, Coronary Artery Disease;  Infectious Diseases: No previous history of significant infectious diseases.; Surgical Procedures: Tonsillectomy;  Trauma History: No previous history of significant trauma.; Cardiology Procedures-Invasive: Cardiac Cath  (left) May 2014; Cardiology Procedures-Noninvasive: Echocardiogram July 2014, cardiac MRI, July 2014;  Cardiac Cath Results: 80 % stenosis PLR, small vessel, EF50%; Left Ventricular Ejection Fraction: LVEF  of 70% documented via echocardiogram on 01/11/2013  ___  FAMILY HISTORY  Father --  Myocardial infarction, Coronary Artery Disease  Mother -- Hypertension    __  CARDIAC RISK FACTORS      Tobacco Abuse: has never used tobacco; Family History of Heart Disease: positive;  Hyperlipidemia: negative; Hypertension: negative;   Diabetes Mellitus: negative; Prior History of Heart Disease: negative;  Obesity: positive; Sedentary Life Style:negative;  JWJ:XBJYNWGN; Menopausal:not applicable  __   SOCIAL HISTORY    Alcohol Use: Does not use alcohol;  Smoking: Does not smoke; Never smoker ([id number]); Diet: Regular diet and 2 cups decaff coffee daily;  Exercise: Exercises daily;   __  PHYSICAL EXAMINATION    Vital Signs:   Blood Pressure:  138/70 Sitting, Left arm, large cuff  132/70 Sitting, Right arm, large cuff  Weight:  260.00 lbs.  Height: 73"  BMI: 34    Pulse: 57/min.       Constitutional: Cooperative, alert and oriented,well developed, well  nourished, in no acute distress. Skin: Warm and dry to touch, no apparent skin lesions, or masses noted.  Head: Normocephalic, normal hair pattern, no masses or tenderness Eyes: conjunctivae and lids unremarkable,  Funduscopic exam and visual fields not performed, glasses ENT: Ears,  Nose and throat reveal no gross abnormalities  Neck: no JVD, no bruits, no masses, non-tender, carotid pulse(s) 3+, brisk Chest: Normal symmetry,  no tenderness to palpation, normal respiratory excursion, no intercostal retraction, no use of accessory muscles, normal diaphragmatic excursion, clear to auscultation and percussion. Cardiac : Regular rhythm, S1 normal, S2 normal, No S3 or S4, Apical impulse not displaced, no murmurs, gallops or rubs detected., no changes with Valsalva maneuver Abdomen : Abdomen soft, bowel sounds normoactive, no masses, no hepatosplenomegaly, non-tender, no bruits Peripheral Pulses: bilateral radial pulse(s) 2+, bilateral  dorsalis pedis pulse(s) 2+, bilateral posterior tibial pulse(s) 2+ Extremities/Back: No deformities, clubbing, cyanosis, erythema or edema observed.  There are no spinal abnormalities noted. Normal muscle strength and tone. Neurological: No gross motor or sensory deficits noted, affect appropriate,  oriented to time, person and place.   __    ECG: Sinus rhythm with marked ST and T wave abnormality. No significant change compared to prior ECG.    IMPRESSIONS:  1. Apical-variant hypertrophic cardiomyopathy, confirmed by evaluation  at [facility]. He appears to be stable with occasional brief palpitations and transient  orthostatic dizziness. He noted increased palpitations when off Ranexa and has  since resumed it.   2. Obesity with sleep apnea.    RECOMMENDATIONS:   1. Holter monitoring (48 hours) to evaluate palpitations and be sure he is not having  evidence of significant ventricular tachycardia.  2. Follow-up with the Cardiomyopathy Clinic at [facility] as scheduled.   3. Return visit in one year  or sooner if new problems arise.  4. The importance of regular mild-to-moderate exercise was reviewed.    Documented by Caralyn Guile, acting as a scribe for Dr. Kinnie Scales, MD, [facility].    I, Dr. Kinnie Scales, personally performed the  services described in this  documentation, as scribed by Caralyn Guile, in my presence, and it is both accurate and complete.    Kinnie Scales, MD, [facility]    cc: Laurene Footman MD  ____________________________   TODAYS ORDERS  12 Lead ECG Today  Diet mgmt edu, guidance and counseling TODAY  Return Visit 30 MIN 1 year  Holter Monitor 48 hour 2 weeks

## 2020-10-09 NOTE — Progress Notes (Signed)
[facility] OFFICE  [address] [place], [place] [address]     Lucas Sanchez    Date of Visit:  04/21/2016  Date of Birth: 19-Jun-1976  Age: 45 yrs.   Medical Record Number: [id number]  __  CURRENT DIAGNOSES     1. 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 Palpitations, R00.2  3. Abnormal electrocardiogram [ECG] [EKG], R94.31  4. Other cardiomyopathies, I42.8  __  ALLERGIES     No Known Drug Allergies  __  MEDICATIONS     1. aspirin 81 mg tablet,delayed release (DR/EC),  1 po qd  2. Flonase 50 mcg/actuation spray,suspension, PRN  3. Ranexa 500 mg tablet,extended release, 1 po bid  4. candesartan 4 mg tablet, 1 po qd  5. multivitamin tablet, 1 po qd  6. fexofenadine 180 mg tablet, prn  7. carvedilol  6.25 mg tablet, 1 po bid  8. Aleve 220 mg capsule, PRN  __  CHIEF COMPLAINT/REASON FOR VISIT  Followup of Cardiomyopathy nonobstructive hypertrophic,  Followup of Other cardiomyopathies and Followup of Palpitations  __  HISTORY OF PRESENT ILLNESS  Lucas Sanchez is a 45 year old male here  for a follow-up visit. He complains of some chest discomfort and some shortness of breath. He noticed more palpitations after going off of renexa, but once he started again his palpitations became far less frequent with palpitation episodes only lasting  2-3 seconds. He also complains of fatigue which he notices may be caused by his sleep apnea. He does not exercise regularly which he states may have caused weight gain and fatigue. His blood pressure today is 118/64.   A lipid panel drawn on September 03, 2015 showed triglycerides is 134, cholesterol is 192, HDL is 32, and LDL is 133 mg/dL.   A Holter report performed on March 16, 2015 shows the underlying rhythm was sinus, no significant supraventricular or ventricular ectopy, and the overall  heart rate ranging from 43 to 111 beats per minute, averaging 70 beats per minute.   __  PAST HISTORY      Past Medical Illnesses: sleep apnea;  Past Cardiac  Illnesses: Palpitations, abnormal EKG, Hypertrophic  cardiomyopathy, apical variant, non-obstructive, Coronary Artery Disease; Infectious Diseases: No previous history of significant infectious diseases.;  Surgical Procedures: Tonsillectomy; Trauma History: No previous history of significant trauma.;  Cardiology Procedures-Invasive: Cardiac Cath (left) May 2014; Cardiology Procedures-Noninvasive: Echocardiogram  July 2014, cardiac MRI, July 2014, Holter Monitor September 2016; Cardiac Cath Results: 80 % stenosis PLR, small vessel, EF50%;  Left Ventricular Ejection Fraction: LVEF of 70% documented via echocardiogram on 01/11/2013  ___   FAMILY HISTORY  Father -- Myocardial infarction, Coronary Artery Disease  Mother --  Hypertension    __  CARDIAC RISK FACTORS     Tobacco Abuse: has never used tobacco;  Family History of Heart Disease: positive; Hyperlipidemia: negative;  Hypertension: negative;  Diabetes Mellitus: negative;  Prior History of Heart Disease: negative; Obesity: positive;  Sedentary Life Style:negative; JWJ:XBJYNWGN; Menopausal :not applicable  __  SOCIAL HISTORY     Alcohol Use: Does not use alcohol; Smoking: Does not smoke; Never smoker ([id number]);  Diet: Regular diet and 2 cups decaff coffee daily; Exercise: Exercises daily;   __   REVIEW OF SYSTEMS    General: Positive for fatigue, Positive for weight gain;  Integumentary: Denies any change in hair or nails, rashes, or skin lesions.; Eyes: Positive for wears  eye glasses/contact lenses; Ears, Nose, Throat, Mouth: Denies any hearing loss, epistaxis, hoarseness or  difficulty speaking.; Respiratory: Positive for dyspnea with exertion, Positive for sleep apnea; Abdominal : Denies ulcer  disease, hematochezia or melena.;Musculoskeletal:Denies any history of venous insufficiency, arthritic symptoms or back problems.;  Neurological : Denies any history of recurrent strokes, TIA, or seizure disorder.; Psychiatric: Positive  for anxiety; Endocrine: Denies  any history of weight change, heat/cold intolerance, polydipsia, or polyuria;  Hematologic/Immunologic: Positive for seasonal allergies  __  PHYSICAL EXAMINATION    Vital  Signs:  Blood Pressure:  118/66 Sitting, Left arm, large cuff  118/64 Sitting, Left arm, large cuff     Weight: 270.00 lbs.  Height: 73"  BMI:  35   Pulse: 61/min. Apical Regular       Constitutional:  Cooperative, alert and oriented,well developed, well nourished, in no acute distress. Skin: Warm and dry to touch, no apparent skin lesions, or masses  noted. Head: Normocephalic, normal hair pattern, no masses or tenderness Eyes : conjunctivae and lids unremarkable, Funduscopic exam and visual fields not performed, glasses ENT: Ears, Nose and throat reveal no gross abnormalities  Neck: no JVD, no bruits, no masses, non-tender, carotid pulse(s) 3+, brisk Chest: Normal symmetry,  no tenderness to palpation, normal respiratory excursion, no intercostal retraction, no use of accessory muscles, normal diaphragmatic excursion, clear to auscultation and percussion. Cardiac : Regular rhythm, S1 normal, S2 normal, No S3 or S4, Apical impulse not displaced, no murmurs, gallops or rubs detected., no changes with Valsalva maneuver Abdomen : Abdomen soft, bowel sounds normoactive, no masses, no hepatosplenomegaly, non-tender, no bruits, moderately obese Peripheral Pulses: bilateral radial  pulse(s) 2+, bilateral dorsalis pedis pulse(s) 2+, bilateral posterior tibial pulse(s) 2+ Extremities/Back: No deformities, clubbing, cyanosis, erythema  or edema observed. There are no spinal abnormalities noted. Normal muscle strength and tone. Neurological: No gross motor or sensory deficits noted,  affect appropriate, oriented to time, person and place.   __  ECG: Sinus rhythm with marked ST and T wave abnormality suggestive of antero-lateral ischemia. No significant change to prior tracing.       IMPRESSIONS:  1. Apical-variant  hypertrophic cardiomyopathy, confirmed by  evaluation at [facility]. He continues to feel relatively well but has noted increased fatigue shortly   after starting carvedilol. His awareness of palpitations remain infrequent as he remains on  Renexa.   2. Obesity with sleep apnea.    RECOMMENDATIONS:    1. Continue medical regimen without change  2 Consider reducing morning dose of carvedilol or switching to long-acting preparation if his significant fatigue persists    3. Follow with his sleep specialist to be sure that his sleep settings are optimized   4. Return visit in one year, or sooner if new problems arise.  5. He will check with Doctor Darin Engels (who recently moved her clinic to [facility])   as  to whether they recommend additional surveillance testing.   6. The importance of regular mild-to-moderate exercise was reviewed.       cc: Laurene Footman MD  Elyse Hsu MD   ____________________________  Christianne Dolin  Return Visit 30 MIN 1 year  12 Lead ECG Today  Diet mgmt edu, guidance and counseling TODAY         Kinnie Scales III MD, [facility]

## 2022-05-06 ENCOUNTER — Other Ambulatory Visit: Payer: Self-pay

## 2022-05-06 ENCOUNTER — Emergency Department (HOSPITAL_BASED_OUTPATIENT_CLINIC_OR_DEPARTMENT_OTHER)
Admission: EM | Admit: 2022-05-06 | Discharge: 2022-05-06 | Disposition: A | Discharge status: Home / Self Care | Payer: 59 | Attending: Emergency Medicine | Admitting: Emergency Medicine

## 2022-05-06 ENCOUNTER — Encounter (HOSPITAL_BASED_OUTPATIENT_CLINIC_OR_DEPARTMENT_OTHER): Payer: Self-pay

## 2022-05-06 ENCOUNTER — Emergency Department (HOSPITAL_BASED_OUTPATIENT_CLINIC_OR_DEPARTMENT_OTHER): Payer: 59

## 2022-05-06 DIAGNOSIS — Z7982 Long term (current) use of aspirin: Secondary | ICD-10-CM | POA: Diagnosis not present

## 2022-05-06 DIAGNOSIS — N201 Calculus of ureter: Secondary | ICD-10-CM | POA: Insufficient documentation

## 2022-05-06 DIAGNOSIS — R109 Unspecified abdominal pain: Secondary | ICD-10-CM | POA: Diagnosis present

## 2022-05-06 LAB — COMPREHENSIVE METABOLIC PANEL
ALT: 34 U/L (ref 0–44)
AST: 20 U/L (ref 15–41)
Albumin: 4.6 g/dL (ref 3.5–5.0)
Alkaline Phosphatase: 38 U/L (ref 38–126)
Anion gap: 9 (ref 5–15)
BUN: 27 mg/dL — ABNORMAL HIGH (ref 6–20)
CO2: 26 mmol/L (ref 22–32)
Calcium: 9.2 mg/dL (ref 8.9–10.3)
Chloride: 106 mmol/L (ref 98–111)
Creatinine, Ser: 1.29 mg/dL — ABNORMAL HIGH (ref 0.61–1.24)
GFR, Estimated: >60 mL/min (ref >60)
Glucose, Bld: 95 mg/dL (ref 70–99)
Potassium: 4.1 mmol/L (ref 3.5–5.1)
Sodium: 141 mmol/L (ref 135–145)
Total Bilirubin: 0.4 mg/dL (ref 0.3–1.2)
Total Protein: 7 g/dL (ref 6.5–8.1)

## 2022-05-06 LAB — URINALYSIS, ROUTINE W REFLEX MICROSCOPIC
Bilirubin Urine: NEGATIVE
Glucose, UA: NEGATIVE mg/dL
Ketones, ur: NEGATIVE mg/dL
Leukocytes,Ua: NEGATIVE
Nitrite: NEGATIVE
Protein, ur: NEGATIVE mg/dL
Specific Gravity, Urine: 1.019 (ref 1.005–1.030)
pH: 5.5 (ref 5.0–8.0)

## 2022-05-06 LAB — CBC
HCT: 45.9 % (ref 39.0–52.0)
Hemoglobin: 14.9 g/dL (ref 13.0–17.0)
MCH: 26.6 pg (ref 26.0–34.0)
MCHC: 32.5 g/dL (ref 30.0–36.0)
MCV: 82 fL (ref 80.0–100.0)
Platelets: 165 10*3/uL (ref 150–400)
RBC: 5.6 MIL/uL (ref 4.22–5.81)
RDW: 13.2 % (ref 11.5–15.5)
WBC: 8.1 10*3/uL (ref 4.0–10.5)
nRBC: 0 % (ref 0.0–0.2)

## 2022-05-06 LAB — LIPASE, BLOOD: Lipase: 32 U/L (ref 11–51)

## 2022-05-06 MED ORDER — NAPROXEN 500 MG PO TABS: 500.0000 mg | ORAL_TABLET | Freq: Two times a day (BID) | ORAL | 0 refills | Status: AC

## 2022-05-06 MED ORDER — KETOROLAC TROMETHAMINE 30 MG/ML IJ SOLN
30.0000 mg | Freq: Once | INTRAMUSCULAR | Status: AC
  Administered 2022-05-06: 30 mg via INTRAVENOUS
  Filled 2022-05-06: qty 1

## 2022-05-06 MED ORDER — TAMSULOSIN HCL 0.4 MG PO CAPS: 0.4000 mg | ORAL_CAPSULE | Freq: Every day | ORAL | 0 refills | Status: AC

## 2022-05-06 NOTE — ED Provider Notes (Signed)
[facility] EMERGENCY DEPT  Provider Note  CSN: [id number] Arrival date & time: 05/06/22 0114  History Chief Complaint  Patient presents with   Flank Pain    Paul English is a 46 y.o. male presents for evaluation of 2-3 hours of R flank pain radiating to RLQ, felt like he needed to have a BM but pain not improved after. No fever, vomiting or dysuria. No prior history of similar.   Home Medications Prior to Admission medications   Medication Sig Start Date End Date Taking? Authorizing Provider  naproxen (NAPROSYN) 500 MG tablet Take 1 tablet (500 mg total) by mouth 2 (two) times daily. 05/06/22  Yes Pollyann Savoy, MD  tamsulosin (FLOMAX) 0.4 MG CAPS capsule Take 1 capsule (0.4 mg total) by mouth daily for 14 days. 05/06/22 05/20/22 Yes Pollyann Savoy, MD  aspirin EC 81 MG tablet Take 81 mg by mouth daily. Swallow whole.    [provider]  candesartan (ATACAND) 4 MG tablet Take 4 mg by mouth daily. 10/15/19   [provider]  cyclobenzaprine (FLEXERIL) 5 MG tablet Take 5 mg by mouth at bedtime.    [provider]  diclofenac (VOLTAREN) 75 MG EC tablet TAKE 1 TABLET BY MOUTH TWICE DAILY 07/31/20   Lenn Sink, DPM  fexofenadine (ALLEGRA) 60 MG tablet Take 60 mg by mouth at bedtime.    [provider]  Multiple Vitamin (MULTIVITAMIN) tablet Take 1 tablet by mouth daily.    [provider]  verapamil (CALAN-SR) 180 MG CR tablet Take 180 mg by mouth daily. 10/15/19   [provider]     Allergies    Patient has no known allergies.   Review of Systems   Review of Systems Please see HPI for pertinent positives and negatives  Physical Exam BP (!) 145/65 (BP Location: Right Arm)   Pulse 77   Temp (!) 97.5 F (36.4 C) (Temporal)   Resp 18   Ht 6\' 1"  (1.854 m)   Wt 129.3 kg   SpO2 100%   BMI 37.61 kg/m   Physical Exam Vitals and nursing note reviewed.  Constitutional:      Appearance: Normal  appearance.  HENT:     Head: Normocephalic and atraumatic.     Nose: Nose normal.     Mouth/Throat:     Mouth: Mucous membranes are moist.  Eyes:     Extraocular Movements: Extraocular movements intact.     Conjunctiva/sclera: Conjunctivae normal.  Cardiovascular:     Rate and Rhythm: Normal rate.  Pulmonary:     Effort: Pulmonary effort is normal.     Breath sounds: Normal breath sounds.  Abdominal:     General: Abdomen is flat.     Palpations: Abdomen is soft.     Tenderness: There is no abdominal tenderness.  Musculoskeletal:        General: No swelling. Normal range of motion.     Cervical back: Neck supple.  Skin:    General: Skin is warm and dry.  Neurological:     General: No focal deficit present.     Mental Status: He is alert.  Psychiatric:        Mood and Affect: Mood normal.     ED Results / Procedures / Treatments   EKG None  Procedures Procedures  Medications Ordered in the ED Medications  ketorolac (TORADOL) 30 MG/ML injection 30 mg (30 mg Intravenous Given 05/06/22 0425)    Initial Impression and Plan  Patient with R flank pain, most consistent with renal colic. Labs done in triage show normal CBC, CMP with mildly increased Cr from remote baseline. UA with blood but no infection. I personally viewed the images from radiology studies and agree with radiologist interpretation: CT shows small distal R ureteral stone as the cause of his symptoms. Will give a dose of Toradol and reassess for dispo.    ED Course   Clinical Course as of 05/06/22 0542  Tue May 06, 2022  0540 Patient feeling much better after Toradol. Plan discharge with Rx for Naprosyn, Flomax and urology follow up. RTED if pain not controlled, fever or intractable vomiting.  [CS]    Clinical Course User Index [CS] Truddie Hidden, MD     MDM Rules/Calculators/A&P Medical Decision Making Problems Addressed: Right ureteral stone: acute illness or injury  Amount and/or  Complexity of Data Reviewed Labs: ordered. Decision-making details documented in ED Course. Radiology: ordered and independent interpretation performed. Decision-making details documented in ED Course.  Risk Prescription drug management.    Final Clinical Impression(s) / ED Diagnoses Final diagnoses:  Right ureteral stone    Rx / DC Orders ED Discharge Orders          Ordered    naproxen (NAPROSYN) 500 MG tablet  2 times daily        05/06/22 0542    tamsulosin (FLOMAX) 0.4 MG CAPS capsule  Daily        05/06/22 0542             Truddie Hidden, MD 05/06/22 [phone]

## 2022-05-06 NOTE — ED Triage Notes (Signed)
Patient here POV from Home.  Endorses Sudden Onset of Pain at 1230 today that awakened the Patient. No Urinary Symptoms but endorses not urinating since Pain began.  Mild nausea. No Emesis.   NAD Noted during Triage. A&Ox4. GCS 15. Ambulatory.

## 2022-05-22 IMAGING — CR DG RIBS W/ CHEST 3+V*R*
4 series · 4 of 4 positions shown · non-contrast
Comparison: None.

CLINICAL DATA: Right-sided rib pain with movement, no known injury,
initial encounter

EXAM:
RIGHT RIBS AND CHEST - 3+ VIEW

[w chest pa]
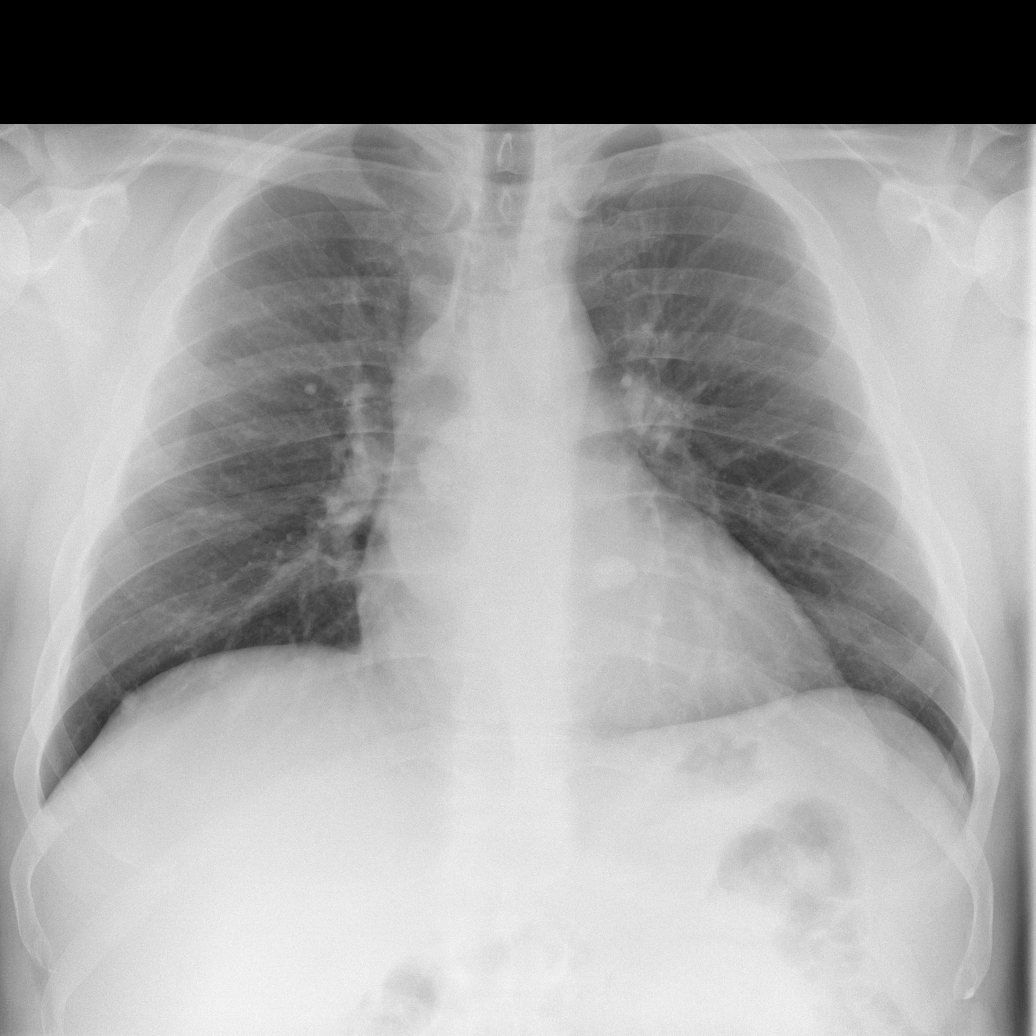

[w ribs ap/pa upper right]
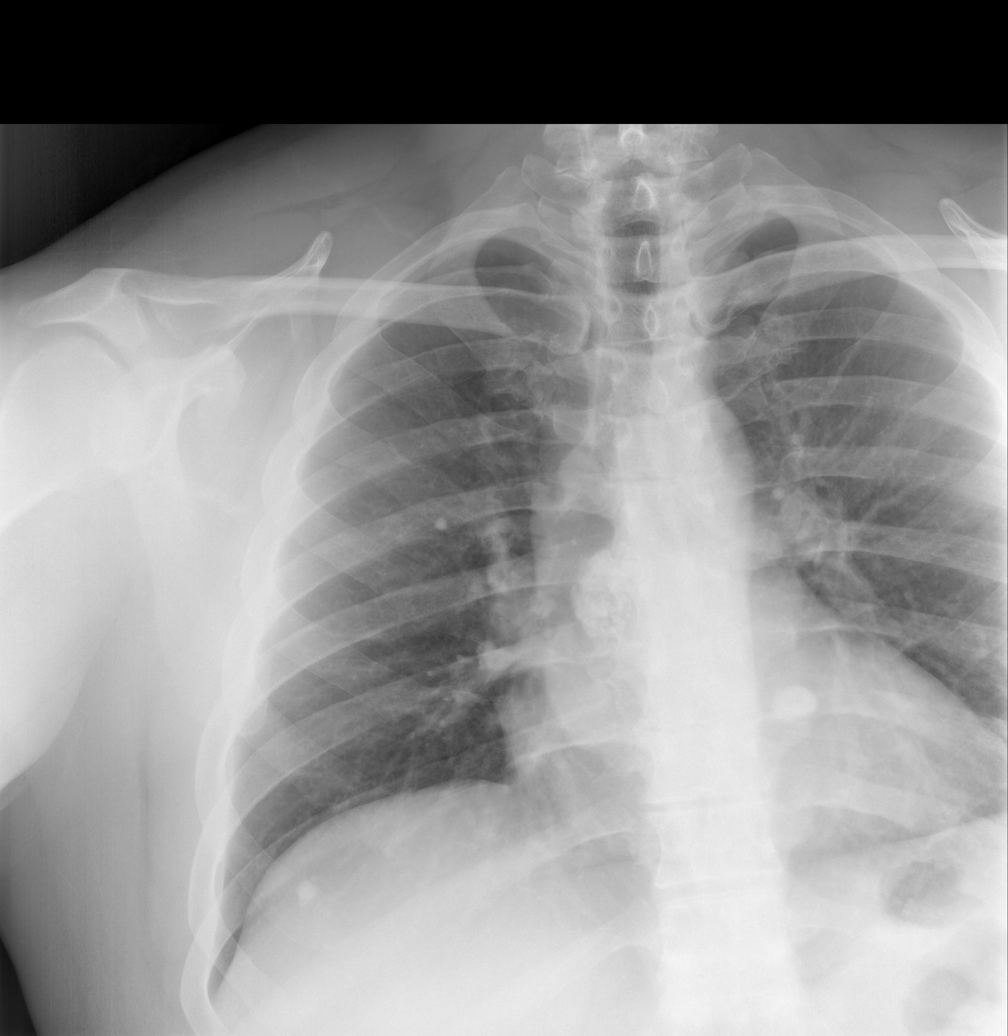

[w ribs ap/pa lower right]
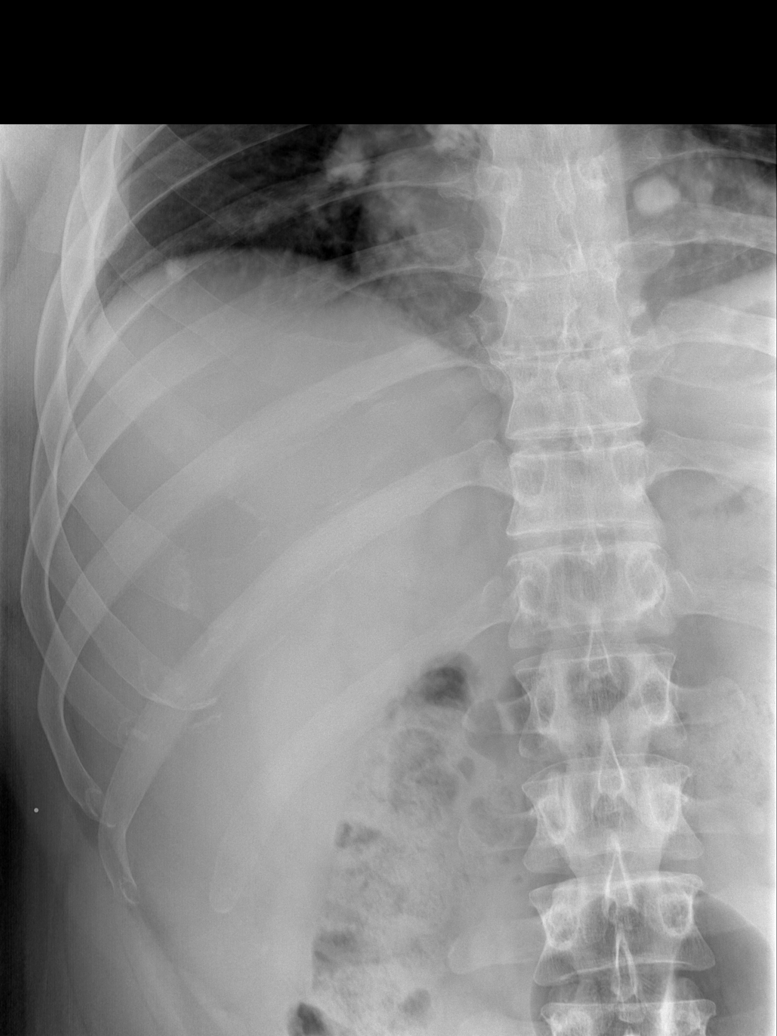

[w ribs oblique right]
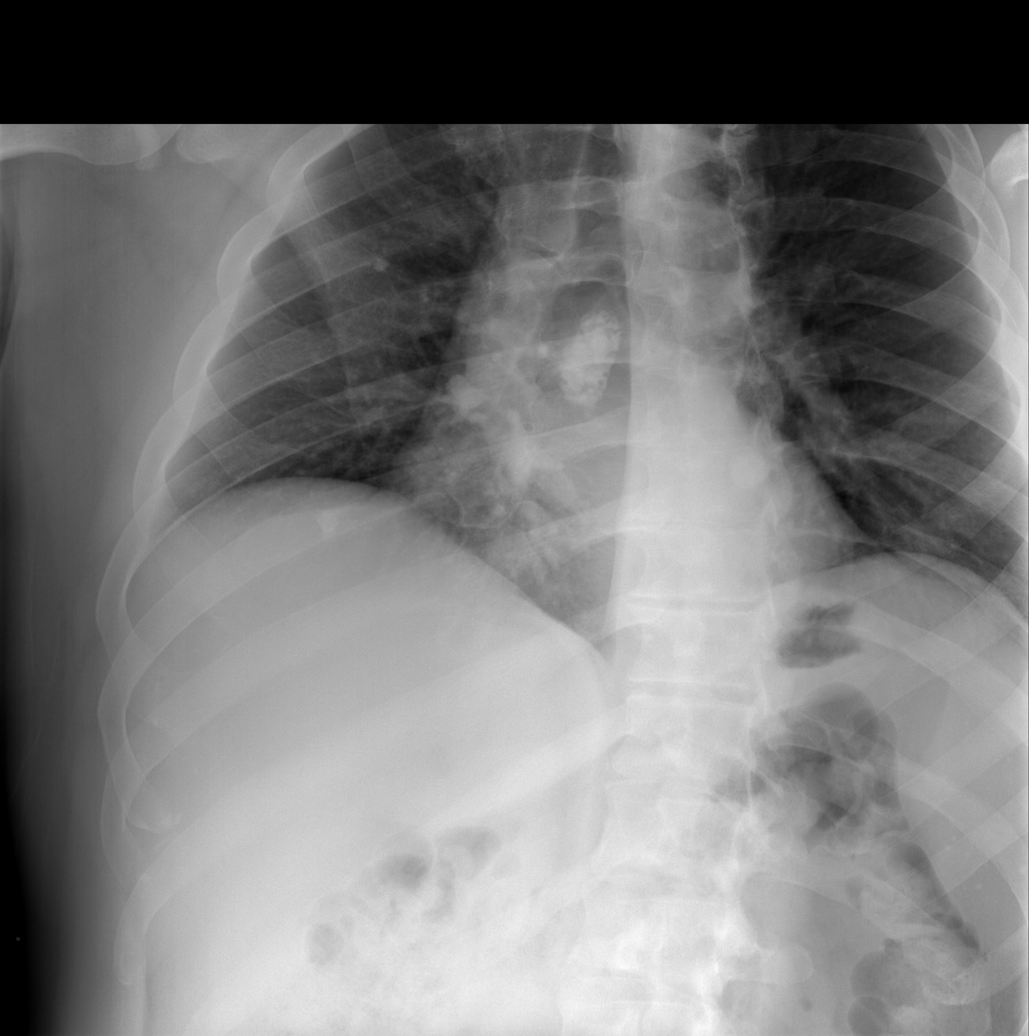

[4 of 4 positions shown; findings below may reference images not displayed]

FINDINGS: Cardiac shadow is within normal limits. The lungs are well aerated
bilaterally. Calcified granulomas and calcified mediastinal nodes
are seen. No infiltrate or effusion is noted. No rib fracture is
seen.
IMPRESSION: Changes of prior granulomatous disease.

No acute rib abnormality is noted.

## 2023-04-17 ENCOUNTER — Other Ambulatory Visit (HOSPITAL_BASED_OUTPATIENT_CLINIC_OR_DEPARTMENT_OTHER): Payer: Self-pay

## 2023-04-17 MED ORDER — WEGOVY 0.25 MG/0.5ML ~~LOC~~ SOAJ
0.2500 mg | SUBCUTANEOUS | 0 refills | Status: DC
  Filled 2023-04-17: qty 2, 28d supply, fill #0

## 2023-05-12 ENCOUNTER — Other Ambulatory Visit (HOSPITAL_BASED_OUTPATIENT_CLINIC_OR_DEPARTMENT_OTHER): Payer: Self-pay

## 2023-05-12 MED ORDER — WEGOVY 0.25 MG/0.5ML ~~LOC~~ SOAJ
0.2500 mg | SUBCUTANEOUS | 1 refills | Status: AC
  Filled 2023-05-12: qty 2, 28d supply, fill #0

## 2023-05-13 ENCOUNTER — Other Ambulatory Visit (HOSPITAL_BASED_OUTPATIENT_CLINIC_OR_DEPARTMENT_OTHER): Payer: Self-pay

## 2023-07-17 ENCOUNTER — Telehealth (INDEPENDENT_AMBULATORY_CARE_PROVIDER_SITE_OTHER): Payer: Self-pay | Admitting: Otolaryngology

## 2023-07-17 NOTE — Telephone Encounter (Signed)
 Called and left voicemail with address for appointment on 07/20/2023.

## 2023-07-20 ENCOUNTER — Ambulatory Visit: Payer: 59 | Admitting: Podiatry

## 2023-07-20 ENCOUNTER — Encounter (INDEPENDENT_AMBULATORY_CARE_PROVIDER_SITE_OTHER): Payer: Self-pay

## 2023-07-20 ENCOUNTER — Ambulatory Visit (INDEPENDENT_AMBULATORY_CARE_PROVIDER_SITE_OTHER): Payer: 59 | Admitting: Audiology

## 2023-07-20 ENCOUNTER — Ambulatory Visit (INDEPENDENT_AMBULATORY_CARE_PROVIDER_SITE_OTHER): Payer: 59 | Admitting: Otolaryngology

## 2023-07-20 VITALS — BP 136/75 | HR 66 | Resp 19 | Ht 73.0 in | Wt 263.0 lb

## 2023-07-20 DIAGNOSIS — H9313 Tinnitus, bilateral: Secondary | ICD-10-CM | POA: Diagnosis not present

## 2023-07-20 DIAGNOSIS — H9042 Sensorineural hearing loss, unilateral, left ear, with unrestricted hearing on the contralateral side: Secondary | ICD-10-CM | POA: Diagnosis not present

## 2023-07-20 DIAGNOSIS — H903 Sensorineural hearing loss, bilateral: Secondary | ICD-10-CM

## 2023-07-20 NOTE — Progress Notes (Signed)
  [address], [address] [place], [place] [address] [phone]  Audiological Evaluation    Name: Paul English     DOB:   Jan 06, 1976      MRN:   [id number]                                                                                     Service Date: 07/20/2023     Accompanied by: unaccompanied   Patient comes today after Dr. Tobie, ENT sent a referral for a hearing evaluation due to concerns with bilateral tinnitus.   Symptoms Yes Details  Hearing loss  []  Not perceived. Reports has annual hearing screenings at work (apparently normal screenings)  Tinnitus  [x]  Bilateral, ringing, onset about 3-4 months ago. Reports that he had started some time earlier Wegovy  and also may have taken aleve  more often). Reports that it does not affect his sleep.  Ear pain/ Ear infections  []    Balance problems  [x]  Reports that randomly may loose balance.  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  Previous ear surgeries  [x]  Tubes as a child  Family history  []    Amplification  []    Other  []      Otoscopy: Right ear: Clear external ear canals and notable landmarks visualized on the tympanic membrane. Left ear:  Clear external ear canals and notable landmarks visualized on the tympanic membrane.  Tympanometry: Right ear: Type A- Normal external ear canal volume with normal middle ear pressure and tympanic membrane compliance Left ear: Type A- Normal external ear canal volume with normal middle ear pressure and tympanic membrane compliance    Pure tone Audiometry: Right ear- Normal hearing from [phone] Hz, then borderline normal at 8000 Hz and no response obtained at 90dBHL (maximum equipment levels) at 12k Hz.   Left ear- Normal hearing from [phone] Hz ( borderline normal at 8000 Hz) and moderately severe presumably sensorineural hearing loss at 12Hz .    The hearing test results were completed under headphones and  re-checked with inserts and results are deemed to be of good reliability. Test technique:  conventional     Speech Audiometry: Right ear- Speech Reception Threshold (SRT) was obtained at 5 dBHL Left ear-Speech Reception Threshold (SRT) was obtained at 10 dBHL   Word Recognition Score Tested using NU-6 (MLV) Right ear: 100% was obtained at a presentation level of 55 dBHL with contralateral masking which is deemed as  excellent Left ear: 100% was obtained at a presentation level of 55 dBHL with contralateral masking which is deemed as  excellent     Recommendations: Follow up with ENT as scheduled for today. Repeat audiogram in 12 months or before if concerns with hearing changes arise or per MD recommendation. Use hearing protection when exposed to loud/damaging sounds.    Jaylyne Breese MARIE LEROUX-MARTINEZ, AUD

## 2023-07-20 NOTE — Progress Notes (Signed)
 Dear Dr. Samie, Here is my assessment for our mutual patient, Paul English. Thank you for allowing me the opportunity to care for your patient. Please do not hesitate to contact me should you have any other questions. Sincerely, Dr. Eldora Blanch  Otolaryngology Clinic Note Referring provider: Dr. Samie HPI:  Paul English is a 48 y.o. male kindly referred by Dr. Samie for evaluation of bilateral tinnitus.   Initial visit (07/2023): Patient reports: started a while ago but intermittent. One morning it woke him up, and then went away. Now persistent for past few months. Intensity varies - when he is focused, less noticeable, but when in quiet he notices it and it is bothersome. High pitch, ringing. Not pulsatile. He feels like his hearing has not declined. Not bothering him during sleep. No antecedent event including significant noise exposure. Always has maybe some neck pain, but not worse and this is chronic. No recent medication changes - started Wegovy  afterwards.  Patient denies: ear pain, fullness, vertigo, drainage Patient additionally denies: deep pain in ear canal, eustachian tube symptoms such as popping, crackling, sensitive to pressure changes Patient also denies barotrauma, vestibular suppressant use, ototoxic medication use H/o ear infections as a kid with BTT  H&N Surgery: no Personal or FHx of bleeding dz or anesthesia difficulty: no  GLP-1: Wegovy  AP/AC: ASA 81, does take NSAIDs regularly  PMHx: Cardiomyopathy, HTN, OSA  Tobacco: no. Occupation: Art Gallery Manager. Lives in [place], [place]  Independent Review of Additional Tests or Records:  Dr. Samie (02/27/2023 and 05/2023) PCP referral notes - noted OSA and bilateral tinnitus; Dx: Tinnitus; Rx: ref ENT CMP 02/2023: Cr 1.06, wnl CBC 02/2022: wnl 07/20/2023 Audiogram was independently reviewed and interpreted by me and I agree with read: A/A tymps Right ear- Normal hearing from [phone] Hz, then borderline normal at 8000 Hz and no  response obtained at 90dBHL (maximum equipment levels) at 12k Hz.   Left ear- Normal hearing from [phone] Hz ( borderline normal at 8000 Hz) and moderately severe presumably sensorineural hearing loss at 12Hz .   Right ear: 100% was obtained at a presentation level of 55 dBHL with contralateral masking which is deemed as  excellent; Left ear: 100% was obtained at a presentation level of 55 dBHL with contralateral masking which is deemed as  excellent  SNHL= Sensorineural hearing loss   PMH/Meds/All/SocHx/FamHx/ROS:  History reviewed. No pertinent past medical history.   History reviewed. No pertinent surgical history.  History reviewed. No pertinent family history.   Social Connections: Moderately Integrated (02/27/2023)   Received from [facility]   Social Network    How would you rate your social network (family, work, friends)?: Adequate participation with social networks      Current Outpatient Medications:    candesartan (ATACAND) 4 MG tablet, Take 4 mg by mouth daily., Disp: , Rfl:    cyclobenzaprine (FLEXERIL) 5 MG tablet, Take 5 mg by mouth at bedtime., Disp: , Rfl:    diclofenac  (VOLTAREN ) 75 MG EC tablet, TAKE 1 TABLET BY MOUTH TWICE DAILY, Disp: 50 tablet, Rfl: 2   fexofenadine (ALLEGRA) 60 MG tablet, Take 60 mg by mouth at bedtime., Disp: , Rfl:    Multiple Vitamin (MULTIVITAMIN) tablet, Take 1 tablet by mouth daily., Disp: , Rfl:    naproxen  (NAPROSYN ) 500 MG tablet, Take 1 tablet (500 mg total) by mouth 2 (two) times daily., Disp: 30 tablet, Rfl: 0   Semaglutide -Weight Management (WEGOVY ) 0.25 MG/0.5ML SOAJ, Inject 0.25 mg into the skin once a week., Disp: 2  mL, Rfl: 1   verapamil (CALAN-SR) 180 MG CR tablet, Take 180 mg by mouth daily., Disp: , Rfl:    aspirin EC 81 MG tablet, Take 81 mg by mouth daily. Swallow whole. (Patient not taking: Reported on 07/20/2023), Disp: , Rfl:    Tadalafil 2.5 MG TABS, Take 2.5 mg by mouth once as needed., Disp: , Rfl:    Physical Exam:    BP 136/75 (BP Location: Right Arm, Patient Position: Sitting, Cuff Size: Large)   Pulse 66   Resp 19   Ht 6' 1 (1.854 m)   Wt 263 lb (119.3 kg)   SpO2 97%   BMI 34.70 kg/m   Salient findings:  CN II-XII intact  Bilateral EAC clear and TM intact with well pneumatized middle ear spaces Weber 512: mid Rinne 512: AC > BC b/l Anterior rhinoscopy: Septum mild dev riht; bilateral inferior turbinates without significant hypertrophy No lesions of oral cavity/oropharynx; dentition good No obviously palpable neck masses/lymphadenopathy/thyromegaly No respiratory distress or stridor No audible objective pulsatile tinnitus  Seprately Identifiable Procedures:  None  Impression & Plans:  Almer Bushey is a 48 y.o. male with:  1. Bilateral tinnitus   2. Sensorineural hearing loss (SNHL) of both ears    Bilateral, non-pulsatile. Audio today shows bilateral downsloping SNHL, which is in general symmetric except for 12K Hz We discussed options: 1. Masking 2. HA 3. Further imaging (does not meet criteria currently) We discussed the nature of tinnitus and management. He opted to proceed with masking strategies.  We will follow up in 1 year with audiogram   See below regarding exact medications prescribed this encounter including dosages and route: No orders of the defined types were placed in this encounter.     Thank you for allowing me the opportunity to care for your patient. Please do not hesitate to contact me should you have any other questions.  Sincerely, Eldora Blanch, MD Otolarynoglogist (ENT), [facility] Phone: [phone] Fax: [phone]  07/20/2023, 4:47 PM   MDM:  Level 4 Complexity/Problems addressed: multiple chronic problems Data complexity: mod - independent review of notes, labs; ordering tests - Morbidity: low  - Prescription Drug prescribed or managed: no

## 2023-08-10 ENCOUNTER — Institutional Professional Consult (permissible substitution): Payer: 59 | Admitting: Internal Medicine

## 2023-09-03 ENCOUNTER — Ambulatory Visit: Payer: 59 | Admitting: Internal Medicine

## 2023-11-11 ENCOUNTER — Ambulatory Visit: Payer: 59 | Admitting: Internal Medicine

## 2024-02-22 ENCOUNTER — Ambulatory Visit: Admitting: Internal Medicine

## 2024-04-26 ENCOUNTER — Ambulatory Visit: Admitting: Genetic Counselor

## 2024-04-26 ENCOUNTER — Ambulatory Visit: Attending: Internal Medicine | Admitting: Internal Medicine

## 2024-04-26 VITALS — BP 120/69 | HR 65 | Ht 73.0 in | Wt 250.0 lb

## 2024-04-26 DIAGNOSIS — E7849 Other hyperlipidemia: Secondary | ICD-10-CM

## 2024-04-26 DIAGNOSIS — I10 Essential (primary) hypertension: Secondary | ICD-10-CM

## 2024-04-26 DIAGNOSIS — G4733 Obstructive sleep apnea (adult) (pediatric): Secondary | ICD-10-CM

## 2024-04-26 DIAGNOSIS — I422 Other hypertrophic cardiomyopathy: Secondary | ICD-10-CM

## 2024-04-26 DIAGNOSIS — I700 Atherosclerosis of aorta: Secondary | ICD-10-CM

## 2024-04-26 NOTE — Patient Instructions (Addendum)
 Medication Instructions:  Your physician recommends that you continue on your current medications as directed. Please refer to the Current Medication list given to you today.  *If you need a refill on your cardiac medications before your next appointment, please call your pharmacy*  Lab Work: IN 2 MONTHS: Fasting lipid panel and Lipoprotein A  If you have labs (blood work) drawn today and your tests are completely normal, you will receive your results only by: MyChart Message (if you have MyChart) OR A paper copy in the mail If you have any lab test that is abnormal or we need to change your treatment, we will call you to review the results.  Testing/Procedures: Your physician has requested that you have a cardiac MRI. Cardiac MRI uses a computer to create images of your heart as its beating, producing both still and moving pictures of your heart and major blood vessels. For further information please visit [email or web address]. Please follow the instruction sheet given to you today for more information.   Follow-Up: At [facility], you and your health needs are our priority.  As part of our continuing mission to provide you with exceptional heart care, our providers are all part of one team.  This team includes your primary Cardiologist (physician) and Advanced Practice Providers or APPs (Physician Assistants and Nurse Practitioners) who all work together to provide you with the care you need, when you need it.  Your next appointment:   1 year(s)  Provider:   Stanly Leavens, MD or Orren Fabry, PA-C        We recommend signing up for the patient portal called MyChart.  Sign up information is provided on this After Visit Summary.  MyChart is used to connect with patients for Virtual Visits (Telemedicine).  Patients are able to view lab/test results, encounter notes, upcoming appointments, etc.  Non-urgent messages can be sent to your provider as well.   To learn more about  what you can do with MyChart, go to [email or web address].   Other Instructions  You are scheduled for Cardiac MRI at the location below.  Please arrive for your appointment at ______________ . ?  [facility] [address] [place], [place] [address] Please take advantage of the free valet parking available at the [facility] and [employer] (Entrance C).  Proceed to the [facility] Radiology Department (First Floor) for check-in.   OR   [facility] [address] [place], [place] [address] Please go to the [facility] and check-in with the desk attendant.   Magnetic resonance imaging (MRI) is a painless test that produces images of the inside of the body without using Xrays.  During an MRI, strong magnets and radio waves work together in a Data processing manager to form detailed images.   MRI images may provide more details about a medical condition than X-rays, CT scans, and ultrasounds can provide.  You may be given earphones to listen for instructions.  You may eat a light breakfast and take medications as ordered with the exception of furosemide, hydrochlorothiazide, chlorthalidone or spironolactone (or any other fluid pill). If you are undergoing a stress MRI, please avoid stimulants for 12 hr prior to test. (I.e. Caffeine, nicotine, chocolate, or antihistamine medications)  If your provider has ordered anti-anxiety medications for this test, then you will need a driver.  An IV will be inserted into one of your veins. Contrast material will be injected into your IV. It will leave your body through your  urine within a day. You may be told to drink plenty of fluids to help flush the contrast material out of your system.  You will be asked to remove all metal, including: Watch, jewelry, and other metal objects including hearing aids, hair pieces and dentures. Also wearable glucose monitoring systems (ie. Freestyle Libre and Omnipods) (Braces and  fillings normally are not a problem.)   TEST WILL TAKE APPROXIMATELY 1 HOUR  PLEASE NOTIFY SCHEDULING AT LEAST 24 HOURS IN ADVANCE IF YOU ARE UNABLE TO KEEP YOUR APPOINTMENT. [phone]  For more information and frequently asked questions, please visit our website : [email or web address]  Please call the Cardiac Imaging Nurse Navigators with any questions/concerns. [phone] Office

## 2024-04-26 NOTE — Progress Notes (Signed)
 Cardiology Office Note:  .    Date:  04/26/2024  ID:  Paul English, DOB [birth year], MRN [id number] PCP: Samie Frederick, PA-C  [facility] Providers Cardiologist:  None     CC: ApHCM Consulted for the evaluation of HCM at the behest of Dr. Cesario   History of Present Illness: Paul English    Paul English is a 48 y.o. 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  He has a history of apical hypertrophic cardiomyopathy with a maximal septal thickness of 17 mm and no outflow tract obstruction. Genetic testing was negative, and his risk for sudden cardiac death is calculated at 1.18%. A stress MRI in 2019 showed diffuse patchy late gadolinium enhancement. No significant arrhythmias were noted on a heart monitor in 2022.  He is on verapamil for his condition and reports that his blood pressure is well controlled on dual therapy. He experiences episodes where he feels his heart 'stops' and takes his breath away, lasting a second or two, but these episodes have not been captured on monitoring and are reportedly slightly better.  He has a family history of heart disease, with his father having a heart attack at 26 despite being healthy and active. His uncle died suddenly at 7, and there is a history of heart attacks on his father's side, often associated with smoking and drinking. His children are undergoing screening for hypertrophic cardiomyopathy.  He has lost approximately 50 pounds and is actively managing his weight through exercise, including walking on a treadmill and using dumbbells. He reports no smoking or drug use.  He has been diagnosed with aortic atherosclerosis and has a history of mildly abnormal cholesterol levels despite significant weight loss. He has previously been on cholesterol medications but experienced adverse effects, including extreme fatigue and low heart rate, leading to discontinuation. His LDL cholesterol was last recorded at 111  mg/dL.  Discussed the use of AI scribe software for clinical note transcription with the patient, who gave verbal consent to proceed.   Relevant histories: .  Social  - Tobacco: Denies smoking - Employment: Theme park manager - Living Situation: Lives in [place] , lived in [place], has two kids both are negative from HCM - hx of early CAD (father) ROS: As per HPI.  Physical Exam:    VS:  BP 120/69   Pulse 65   Ht 6' 1 (1.854 m)   Wt 250 lb (113.4 kg)   SpO2 96%   BMI 32.98 kg/m    Wt Readings from Last 3 Encounters:  04/26/24 250 lb (113.4 kg)  07/20/23 263 lb (119.3 kg)  05/06/22 285 lb 0.9 oz (129.3 kg)    Gen: no distress   Neck: No JVD Cardiac: No Rubs or Gallops, no Murmur, RRR +2 radial pulses Respiratory: Clear to auscultation bilaterally, normal effort, normal  respiratory rate GI: Soft, nontender, non-distended  MS: No  edema;  moves all extremities Integument: Skin feels warm Neuro:  At time of evaluation, alert and oriented to person/place/time/situation  Psych: Normal affect, patient feels ok  ASSESSMENT AND PLAN: .    Hypertrophic Cardiomyopathy - Apical Variant - maximal apical thickness 17 mm with no aneurysm - without MR, Apical Aneurysm - suspicion of Fabry's/Danon/Noonan's or other mimics of HCM: Low - Gene variant: Negative - Indexed assessment: [email or web address] - NYHA I  - Non HCM Contributors to disease/status Aortic atherosclerosis Aortic atherosclerosis identified on CT scan. Family history of premature coronary artery disease. Despite significant  weight loss, LDL remains elevated, suggesting a possible genetic component. Discussed the potential for genetic influence on cholesterol levels and the need for aggressive management due to family history and presence of vascular calcium. - Order fasting lipids and LP(a) in 2-3 months. - Discuss potential initiation of cholesterol-lowering therapy based on lipid  results. - Consider non-statin medication such as ezetimibe if LDL remains elevated. - Consider PCSK9 inhibitor if statin intolerance is confirmed.  Hyperlipidemia with elevated LDL and family history of premature coronary artery disease Hyperlipidemia with elevated LDL despite significant weight loss. Family history of premature coronary artery disease. Previous intolerance to statin therapy noted. Discussed the potential for genetic influence on cholesterol levels and the need for aggressive management due to family history and presence of vascular calcium. - Order fasting lipids and LP(a) in 2-3 months. - Discuss potential initiation of cholesterol-lowering therapy based on lipid results. - Consider non-statin medication such as ezetimibe if LDL remains elevated. - Consider PCSK9 inhibitor if statin intolerance is confirmed.  Essential hypertension Essential hypertension well-controlled on dual therapy with verapamil and candesartan. Blood pressure remains stable. Discussed potential for medication adjustment if blood pressure decreases further with continued weight loss. - Continue verapamil and candesartan. - Monitor blood pressure regularly.  Obesity due to excess calories, improved with weight loss Significant weight loss of approximately 60 pounds achieved through diet and exercise. Continued efforts in weight management are encouraged. - Encourage continued weight management through diet and exercise. - Monitor weight and provide support for ongoing weight loss efforts.  Family history , Discussed family screening  - children are being screened via echo  SCD  Assessment - SCD risk estimated to be 1.18% at 5 years SDM: we have discussed   Atrial fibrillation Assessment  - HCM-AF score 14 - Atrial arrhythmia management: if new palpitations, repeat heart monitor  Medication symptom plan - Apical hypertrophic cardiomyopathy with late gadolinium enhancement noted on cardiac MRI  in 2019. No family history of HCM or sudden cardiac death, but there is a family history of heart failure and heart attack on the father's side. No evidence of apical aneurysm or outflow tract obstruction. Echocardiogram in 2023 and heart monitor in 2022 showed no significant arrhythmias. Risk of sudden cardiac death is low at 1.18%. - Order cardiac MRI to assess for changes since 2019. - Continue verapamil and candesartan (refill and take over mgmt) - Monitor for symptoms such as palpitations or syncope for new heart monitor   One year unless CMR issues or CAD    Stanly Leavens, MD FASE [facility] Cardiologist [facility]  [address] [place], [address] [place], [place] [address] [phone]  3:53 PM

## 2024-05-04 ENCOUNTER — Encounter (HOSPITAL_COMMUNITY): Payer: Self-pay

## 2024-05-06 ENCOUNTER — Ambulatory Visit (HOSPITAL_COMMUNITY)
Admission: RE | Admit: 2024-05-06 | Discharge: 2024-05-06 | Disposition: A | Discharge status: Home / Self Care | Source: Ambulatory Visit | Attending: Internal Medicine | Admitting: Internal Medicine

## 2024-05-06 ENCOUNTER — Other Ambulatory Visit: Payer: Self-pay | Admitting: Internal Medicine

## 2024-05-06 DIAGNOSIS — I422 Other hypertrophic cardiomyopathy: Secondary | ICD-10-CM

## 2024-05-06 DIAGNOSIS — I10 Essential (primary) hypertension: Secondary | ICD-10-CM

## 2024-05-06 MED ORDER — GADOBUTROL 1 MMOL/ML IV SOLN
14.0000 mL | Freq: Once | INTRAVENOUS | Status: AC | PRN
  Administered 2024-05-06: 14 mL via INTRAVENOUS

## 2024-05-09 ENCOUNTER — Ambulatory Visit: Admitting: Internal Medicine

## 2024-05-11 ENCOUNTER — Ambulatory Visit: Payer: Self-pay | Admitting: Internal Medicine

## 2024-05-11 DIAGNOSIS — I493 Ventricular premature depolarization: Secondary | ICD-10-CM

## 2024-05-13 ENCOUNTER — Ambulatory Visit: Attending: Internal Medicine

## 2024-05-13 DIAGNOSIS — I493 Ventricular premature depolarization: Secondary | ICD-10-CM

## 2024-05-13 NOTE — Progress Notes (Unsigned)
 Enrolled for [employer] to mail a ZIO XT long term holter monitor to the patients address on file.

## 2024-06-11 ENCOUNTER — Ambulatory Visit: Payer: Self-pay | Admitting: Internal Medicine

## 2024-06-11 DIAGNOSIS — I493 Ventricular premature depolarization: Secondary | ICD-10-CM

## 2024-06-28 LAB — LIPID PANEL
Chol/HDL Ratio: 4.4 ratio (ref 0.0–5.0)
Cholesterol, Total: 188 mg/dL (ref 100–199)
HDL: 43 mg/dL
LDL Chol Calc (NIH): 130 mg/dL — ABNORMAL HIGH (ref 0–99)
Triglycerides: 80 mg/dL (ref 0–149)
VLDL Cholesterol Cal: 15 mg/dL (ref 5–40)

## 2024-06-28 LAB — LIPOPROTEIN A (LPA): Lipoprotein (a): 32 nmol/L
